# Patient Record
Sex: Male | Born: 1975
Health system: Southern US, Community
[De-identification: ages and names within clinical notes are randomized; demographics above are authoritative.]

## PROBLEM LIST (undated history)

## (undated) DIAGNOSIS — I82409 Acute embolism and thrombosis of unspecified deep veins of unspecified lower extremity: Secondary | ICD-10-CM

## (undated) DIAGNOSIS — E119 Type 2 diabetes mellitus without complications: Secondary | ICD-10-CM

## (undated) HISTORY — PX: NO PAST SURGERIES: SHX2092

---
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
## 2006-06-04 ENCOUNTER — Emergency Department: Payer: Self-pay | Admitting: Emergency Medicine

## 2017-09-13 ENCOUNTER — Other Ambulatory Visit: Payer: Self-pay

## 2017-09-13 ENCOUNTER — Emergency Department: Payer: Self-pay

## 2017-09-13 ENCOUNTER — Emergency Department
Admission: EM | Admit: 2017-09-13 | Discharge: 2017-09-13 | Disposition: A | Discharge status: Home / Self Care | Payer: Self-pay | Attending: Emergency Medicine | Admitting: Emergency Medicine

## 2017-09-13 ENCOUNTER — Encounter: Payer: Self-pay | Admitting: Emergency Medicine

## 2017-09-13 DIAGNOSIS — R519 Headache, unspecified: Secondary | ICD-10-CM

## 2017-09-13 DIAGNOSIS — H53143 Visual discomfort, bilateral: Secondary | ICD-10-CM | POA: Insufficient documentation

## 2017-09-13 DIAGNOSIS — R51 Headache: Secondary | ICD-10-CM

## 2017-09-13 LAB — BASIC METABOLIC PANEL
Anion gap: 10 (ref 5–15)
BUN: 12 mg/dL (ref 6–20)
CALCIUM: 9.1 mg/dL (ref 8.9–10.3)
CHLORIDE: 104 mmol/L (ref 101–111)
CO2: 24 mmol/L (ref 22–32)
CREATININE: 0.95 mg/dL (ref 0.61–1.24)
GFR calc Af Amer: >60 mL/min (ref >60)
GFR calc non Af Amer: >60 mL/min (ref >60)
Glucose, Bld: 87 mg/dL (ref 65–99)
Potassium: 4 mmol/L (ref 3.5–5.1)
Sodium: 138 mmol/L (ref 135–145)

## 2017-09-13 LAB — CBC
HCT: 44.8 % (ref 40.0–52.0)
Hemoglobin: 14.9 g/dL (ref 13.0–18.0)
MCH: 27.3 pg (ref 26.0–34.0)
MCHC: 33.2 g/dL (ref 32.0–36.0)
MCV: 82.1 fL (ref 80.0–100.0)
PLATELETS: 203 10*3/uL (ref 150–440)
RBC: 5.46 MIL/uL (ref 4.40–5.90)
RDW: 13.9 % (ref 11.5–14.5)
WBC: 5.3 10*3/uL (ref 3.8–10.6)

## 2017-09-13 MED ORDER — KETOROLAC TROMETHAMINE 30 MG/ML IJ SOLN
30.0000 mg | Freq: Once | INTRAMUSCULAR | Status: AC
  Administered 2017-09-13: 30 mg via INTRAVENOUS
  Filled 2017-09-13: qty 1

## 2017-09-13 MED ORDER — DIPHENHYDRAMINE HCL 50 MG/ML IJ SOLN
12.5000 mg | Freq: Once | INTRAMUSCULAR | Status: AC
  Administered 2017-09-13: 12.5 mg via INTRAVENOUS
  Filled 2017-09-13: qty 1

## 2017-09-13 MED ORDER — ONDANSETRON 4 MG PO TBDP: 4.0000 mg | ORAL_TABLET | Freq: Three times a day (TID) | ORAL | 0 refills | Status: DC | PRN

## 2017-09-13 MED ORDER — SODIUM CHLORIDE 0.9 % IV BOLUS
1000.0000 mL | Freq: Once | INTRAVENOUS | Status: AC
  Administered 2017-09-13: 1000 mL via INTRAVENOUS

## 2017-09-13 MED ORDER — PROCHLORPERAZINE EDISYLATE 10 MG/2ML IJ SOLN
10.0000 mg | Freq: Once | INTRAMUSCULAR | Status: AC
  Administered 2017-09-13: 10 mg via INTRAVENOUS
  Filled 2017-09-13: qty 2

## 2017-09-13 MED ORDER — KETOROLAC TROMETHAMINE 10 MG PO TABS: 10.0000 mg | ORAL_TABLET | Freq: Three times a day (TID) | ORAL | 0 refills | Status: DC | PRN

## 2017-09-13 NOTE — ED Notes (Signed)
Report given to Katy RN.

## 2017-09-13 NOTE — Discharge Instructions (Addendum)
Please drink plenty of fluids, eat small regular healthy meals throughout the day, and get plenty of rest to prevent headaches.  If you do develop a headache, you may take Tylenol or Toradol for your pain.  If you take Toradol, eat a small amount of food with it to prevent irritation of your stomach.  If you take Toradol, do not take any other NSAID medications including Motrin, ibuprofen, Advil, or Aleve.  Zofran is for nausea and vomiting.  To the emergency department if you develop severe pain, fever, inability to keep down fluids, new changes in vision, speech or mental status, numbness tingling or weakness, difficult he walking, or any other symptoms concerning to you.

## 2017-09-13 NOTE — ED Triage Notes (Signed)
Pt reports that he has had headaches for awhile, when he gets worried it gets worse. He was recently in [place] and they did one MRI there, he was suppose to get another one but was coming back to the [place] so he told them he would get it here. States that it is behind both eyes.

## 2017-09-13 NOTE — ED Provider Notes (Signed)
[facility] Emergency Department Provider Note  ____________________________________________  Time seen: Approximately 5:15 PM  I have reviewed the triage vital signs and the nursing notes.   HISTORY  Chief Complaint Headache    HPI Lucas Reyes is a 42 y.o. male, with a history of headaches, presenting with headache.  The patient reports that for multiple months, he has been having a severe headache, which is worse behind the orbits, and associated with photosensitivity approximately every other day.  He has been in [place], where he underwent MRI of the brain in May, which he was told was normal; however, his doctor had recommended a repeat MRI but he cannot say why.  The patient denies any fever, rash, tick bites, trauma, nausea or vomiting.  No blurred or double vision, numbness tingling or weakness, difficulty walking.  The patient has had mild headaches throughout his life but has not had migraines.  He has taken ibuprofen and Goody's powder with improvement but stopped taking this and cannot say why.  In [place], he also saw an ophthalmologist who recommended glasses for him; however, he feels the prescription is too strong and actually worsens his headaches.  He flew from [place] at the end of last week and then drove from [place] yesterday to come back to [place] and states his headache is slightly worse today but similar in character.  Fh: No family history of brain mass or cancer; no family history of multiple sclerosis or other autoimmune pathology.  History reviewed. No pertinent past medical history.  There are no active problems to display for this patient.   History reviewed. No pertinent surgical history.    Allergies Ivp dye [iodinated diagnostic agents]  History reviewed. No pertinent family history.  Social History Social History   Tobacco Use  . Smoking status: Never Smoker  Substance Use Topics  . Alcohol use: Not Currently   . Drug use: Not Currently    Review of Systems Constitutional: No fever/chills.  Lightheadedness or syncope. Eyes: Reports getting new glasses, which worsen his headache.  No blurred or double vision.  Positive photosensitivity. ENT: No sore throat. No congestion or rhinorrhea.  No dental abnormality. Cardiovascular: Denies chest pain. Denies palpitations. Respiratory: Denies shortness of breath.  No cough. Gastrointestinal: No abdominal pain.  No nausea, no vomiting.  No diarrhea.  No constipation. Genitourinary: Negative for dysuria. Musculoskeletal: Negative for back pain. Skin: Negative for rash. Neurological: + for chronic headaches. No focal numbness, tingling or weakness.  No difficulty walking.  Today photosensitivity    ____________________________________________   PHYSICAL EXAM:  VITAL SIGNS: ED Triage Vitals  Enc Vitals Group     BP 09/13/17 1340 108/65     Pulse Rate 09/13/17 1340 60     Resp 09/13/17 1340 18     Temp 09/13/17 1340 97.9 F (36.6 C)     Temp Source 09/13/17 1340 Oral     SpO2 09/13/17 1340 100 %     Weight 09/13/17 1341 170 lb (77.1 kg)     Height 09/13/17 1341 5\' 6"  (1.676 m)     Head Circumference --      Peak Flow --      Pain Score 09/13/17 1341 9     Pain Loc --      Pain Edu? --      Excl. in GC? --     Constitutional: Alert and oriented. Answers questions appropriately.  Patient does appear to be in some discomfort and has  photosensitivity. Eyes: Conjunctivae are normal.  EOMI. PERRLA.  No horizontal or vertical nystagmus.  Positive photosensitivity.  No scleral icterus. Head: Atraumatic.  No battle sign. Nose: No congestion/rhinnorhea. Mouth/Throat: Mucous membranes are moist.  Neck: No stridor.  Supple.  No JVD.  No meningismus. Cardiovascular: Normal rate, regular rhythm. No murmurs, rubs or gallops.  Respiratory: Normal respiratory effort.  No accessory muscle use or retractions. Lungs CTAB.  No wheezes, rales or  ronchi. Gastrointestinal: Soft, nontender and nondistended.  No guarding or rebound.  No peritoneal signs. Musculoskeletal: No LE edema.  Neurologic:  A&Ox3.  Speech is clear.  Face and smile are symmetric.  EOMI.  Moves all extremities well. Skin:  Skin is warm, dry and intact. No rash noted. Psychiatric: Mood and affect are normal. Speech and behavior are normal.  Normal judgement  ____________________________________________   LABS (all labs ordered are listed, but only abnormal results are displayed)  Labs Reviewed  CBC  BASIC METABOLIC PANEL  URINALYSIS, COMPLETE (UACMP) WITH MICROSCOPIC   ____________________________________________  EKG  Not indicated ____________________________________________  RADIOLOGY  No results found.  ____________________________________________   PROCEDURES  Procedure(s) performed: None  Procedures  Critical Care performed: No ____________________________________________   INITIAL IMPRESSION / ASSESSMENT AND PLAN / ED COURSE  Pertinent labs & imaging results that were available during my care of the patient were reviewed by me and considered in my medical decision making (see chart for details).  42 y.o. male presenting for chronic headaches for the last several months, associated with photosensitivity.  Overall, the patient is hemodynamically stable and afebrile.  He does not give signs or symptoms that would be consistent with meningitis or an acute infectious process.  The differential is broad but includes generalized headaches, stress-induced headaches, migraines, recurrent, intracranial mass, idiopathic hypertension, multiple sclerosis.  I do not see any evidence of CVA today.  We will plan to get basic laboratory studies, an MRI, and reevaluate the patient after symptomatic treatment.  ----------------------------------------- 7:19 PM on 09/13/2017 -----------------------------------------  Patient's work-up in the emergency  department has been reassuring but incomplete.  His laboratory studies do not show any acute process.  He did not give a urine specimen.  Initially, he refused MRI contrast so the MRI order was changed to noncontrast, after I explained to the patient the limitations of that study.  Afterwards, the patient decided that he was not willing to have an MRI in the emergency department.  He understands that by leaving prior to imaging, there are several diagnoses that I am unable to exclude.  He will follow-up with an outpatient neurologist, and I will send him home with symptomatic treatment for his bidaily headaches.  Return precautions were discussed.  ____________________________________________  FINAL CLINICAL IMPRESSION(S) / ED DIAGNOSES  Final diagnoses:  Chronic nonintractable headache, unspecified headache type  Photophobia of both eyes         NEW MEDICATIONS STARTED DURING THIS VISIT:  New Prescriptions   KETOROLAC (TORADOL) 10 MG TABLET    Take 1 tablet (10 mg total) by mouth every 8 (eight) hours as needed for moderate pain (with food).   ONDANSETRON (ZOFRAN ODT) 4 MG DISINTEGRATING TABLET    Take 1 tablet (4 mg total) by mouth every 8 (eight) hours as needed for nausea or vomiting.      Rockne MenghiniNorman, Anne-Caroline, MD 09/13/17 1920

## 2019-11-27 DIAGNOSIS — I82409 Acute embolism and thrombosis of unspecified deep veins of unspecified lower extremity: Secondary | ICD-10-CM

## 2019-11-27 HISTORY — DX: Acute embolism and thrombosis of unspecified deep veins of unspecified lower extremity: I82.409

## 2020-03-03 ENCOUNTER — Other Ambulatory Visit: Payer: Self-pay

## 2020-03-03 ENCOUNTER — Encounter (HOSPITAL_COMMUNITY): Payer: Self-pay | Admitting: *Deleted

## 2020-03-03 ENCOUNTER — Emergency Department (HOSPITAL_COMMUNITY)
Admission: EM | Admit: 2020-03-03 | Discharge: 2020-03-04 | Disposition: A | Discharge status: Home / Self Care | Payer: Self-pay | Attending: Emergency Medicine | Admitting: Emergency Medicine

## 2020-03-03 ENCOUNTER — Emergency Department (HOSPITAL_COMMUNITY): Payer: Self-pay

## 2020-03-03 DIAGNOSIS — R202 Paresthesia of skin: Secondary | ICD-10-CM | POA: Insufficient documentation

## 2020-03-03 DIAGNOSIS — R6889 Other general symptoms and signs: Secondary | ICD-10-CM | POA: Insufficient documentation

## 2020-03-03 DIAGNOSIS — H5500 Unspecified nystagmus: Secondary | ICD-10-CM | POA: Insufficient documentation

## 2020-03-03 DIAGNOSIS — R251 Tremor, unspecified: Secondary | ICD-10-CM | POA: Insufficient documentation

## 2020-03-03 DIAGNOSIS — R5381 Other malaise: Secondary | ICD-10-CM | POA: Insufficient documentation

## 2020-03-03 DIAGNOSIS — E119 Type 2 diabetes mellitus without complications: Secondary | ICD-10-CM | POA: Insufficient documentation

## 2020-03-03 DIAGNOSIS — H539 Unspecified visual disturbance: Secondary | ICD-10-CM | POA: Insufficient documentation

## 2020-03-03 DIAGNOSIS — R569 Unspecified convulsions: Secondary | ICD-10-CM | POA: Insufficient documentation

## 2020-03-03 DIAGNOSIS — R519 Headache, unspecified: Secondary | ICD-10-CM

## 2020-03-03 DIAGNOSIS — Z20822 Contact with and (suspected) exposure to covid-19: Secondary | ICD-10-CM | POA: Insufficient documentation

## 2020-03-03 HISTORY — DX: Type 2 diabetes mellitus without complications: E11.9

## 2020-03-03 LAB — CBC
HCT: 46.4 % (ref 39.0–52.0)
Hemoglobin: 14.4 g/dL (ref 13.0–17.0)
MCH: 26.6 pg (ref 26.0–34.0)
MCHC: 31 g/dL (ref 30.0–36.0)
MCV: 85.6 fL (ref 80.0–100.0)
Platelets: 278 10*3/uL (ref 150–400)
RBC: 5.42 MIL/uL (ref 4.22–5.81)
RDW: 13.5 % (ref 11.5–15.5)
WBC: 4.7 10*3/uL (ref 4.0–10.5)
nRBC: 0 % (ref 0.0–0.2)

## 2020-03-03 LAB — I-STAT CHEM 8, ED
BUN: 19 mg/dL (ref 6–20)
Calcium, Ion: 1.17 mmol/L (ref 1.15–1.40)
Chloride: 101 mmol/L (ref 98–111)
Creatinine, Ser: 1 mg/dL (ref 0.61–1.24)
Glucose, Bld: 155 mg/dL — ABNORMAL HIGH (ref 70–99)
HCT: 47 % (ref 39.0–52.0)
Hemoglobin: 16 g/dL (ref 13.0–17.0)
Potassium: 3.8 mmol/L (ref 3.5–5.1)
Sodium: 139 mmol/L (ref 135–145)
TCO2: 27 mmol/L (ref 22–32)

## 2020-03-03 LAB — COMPREHENSIVE METABOLIC PANEL
ALT: 16 U/L (ref 0–44)
AST: 14 U/L — ABNORMAL LOW (ref 15–41)
Albumin: 4 g/dL (ref 3.5–5.0)
Alkaline Phosphatase: 70 U/L (ref 38–126)
Anion gap: 11 (ref 5–15)
BUN: 16 mg/dL (ref 6–20)
CO2: 26 mmol/L (ref 22–32)
Calcium: 9.5 mg/dL (ref 8.9–10.3)
Chloride: 101 mmol/L (ref 98–111)
Creatinine, Ser: 1.06 mg/dL (ref 0.61–1.24)
GFR, Estimated: >60 mL/min (ref >60)
Glucose, Bld: 159 mg/dL — ABNORMAL HIGH (ref 70–99)
Potassium: 3.7 mmol/L (ref 3.5–5.1)
Sodium: 138 mmol/L (ref 135–145)
Total Bilirubin: 1.3 mg/dL — ABNORMAL HIGH (ref 0.3–1.2)
Total Protein: 7.1 g/dL (ref 6.5–8.1)

## 2020-03-03 LAB — DIFFERENTIAL
Abs Immature Granulocytes: 0.01 10*3/uL (ref 0.00–0.07)
Basophils Absolute: 0 10*3/uL (ref 0.0–0.1)
Basophils Relative: 0 %
Eosinophils Absolute: 0 10*3/uL (ref 0.0–0.5)
Eosinophils Relative: 1 %
Immature Granulocytes: 0 %
Lymphocytes Relative: 39 %
Lymphs Abs: 1.8 10*3/uL (ref 0.7–4.0)
Monocytes Absolute: 0.4 10*3/uL (ref 0.1–1.0)
Monocytes Relative: 8 %
Neutro Abs: 2.4 10*3/uL (ref 1.7–7.7)
Neutrophils Relative %: 52 %

## 2020-03-03 LAB — PROTIME-INR
INR: 1.2 (ref 0.8–1.2)
Prothrombin Time: 15 seconds (ref 11.4–15.2)

## 2020-03-03 LAB — APTT: aPTT: 27 seconds (ref 24–36)

## 2020-03-03 MED ORDER — SODIUM CHLORIDE 0.9% FLUSH: 3.0000 mL | Freq: Once | INTRAVENOUS | Status: DC

## 2020-03-03 NOTE — ED Triage Notes (Signed)
Pt says that he has had a headache for a "month or two". Constant. Feels like a pressure or heaviness. Denies fevers, dizziness, or vomiting. No fevers.  Numbness in the left arm that comes and goes as well for the past month or two. Mae x4, no focal weakness, steady gait.   Dr. Blenda Bridegroom called to speak with me on the phone, says he talked to the patient in the clinic today and he is concerned because the pt had a 10 hour flight yesterday and then started to c/o headache once he landed. Pt had a MRI in [place] in October that was concerning for demylation. Hx of DVT and is on Xarelto.

## 2020-03-03 NOTE — ED Notes (Signed)
Pt has results from negative PCR COVID test from yesterday prior to his international flight.

## 2020-03-04 ENCOUNTER — Emergency Department (HOSPITAL_COMMUNITY): Payer: Self-pay

## 2020-03-04 LAB — RESP PANEL BY RT-PCR (FLU A&B, COVID) ARPGX2
Influenza A by PCR: NEGATIVE
Influenza B by PCR: NEGATIVE
SARS Coronavirus 2 by RT PCR: NEGATIVE

## 2020-03-04 MED ORDER — GADOBUTROL 1 MMOL/ML IV SOLN
7.5000 mL | Freq: Once | INTRAVENOUS | Status: AC | PRN
  Administered 2020-03-04: 7.5 mL via INTRAVENOUS

## 2020-03-04 NOTE — Discharge Instructions (Signed)
Get help right away if you: °Feel weak. °Have trouble walking or moving. °Have problems with speech, understanding, or vision. °Feel confused. °Cannot control your bladder or bowel movements. °Have numbness after an injury. °Develop new weakness in an arm or leg. °Faint. °

## 2020-03-04 NOTE — ED Notes (Signed)
Off floor to MRI

## 2020-03-04 NOTE — ED Notes (Signed)
Pt discharged ambulatory. All questions and concerns addressed. No complaints at this time.   

## 2020-03-04 NOTE — ED Provider Notes (Signed)
[facility] EMERGENCY DEPARTMENT Provider Note   CSN: [id number] Arrival date & time: 03/03/20  2107     History Chief Complaint  Patient presents with  . Headache    Lucas Reyes is a 44 y.o. male  Who presents with headache. He is a dual citizen of the [place] and [place] but spends most of his time in [place]. The  Patient's sxs began in 2019 with left sided head and arm paresthesia. He had a work up then and improved. He had recurrence of sxs with associated left sided head pressure, generalized malaise. He was seen by a neurologist who diagnosed him with central seizures after an EEG. He was started on Keppra. This did not seem to help and he no longer takes this medication. Over the past year his sxs have been intermittent, Waxing and waning. He has had episodes of vertigo, multiple days of insomnia, generalized tremors and and facial fasciculations, Worsening vision and persistent pressure sensation behind his left eye. He had a repeat MRI on Oct. 28 in [place] and was told he may have demyelinating lesions on the scan. He flew here on Monday 03/02/20 as he felt he was not getting the help he needed out of the country. He currently has the head pressure but denies paresthesia. He has a hx of DVT and is on xarelto. He takes 15 mg nightly, but did not take it last night as he did not expect to have a 10 hour wait in the ER.   HPI     Past Medical History:  Diagnosis Date  . Diabetes mellitus without complication (HCC)   . DVT (deep venous thrombosis) (HCC)     There are no problems to display for this patient.   History reviewed. No pertinent surgical history.     No family history on file.  Social History   Tobacco Use  . Smoking status: Never Smoker  Substance Use Topics  . Alcohol use: Not Currently  . Drug use: Not Currently    Home Medications Prior to Admission medications   Medication Sig Start Date End Date Taking? Authorizing Provider  ketorolac  (TORADOL) 10 MG tablet Take 1 tablet (10 mg total) by mouth every 8 (eight) hours as needed for moderate pain (with food). 09/13/17   Rockne Menghini, MD  ondansetron (ZOFRAN ODT) 4 MG disintegrating tablet Take 1 tablet (4 mg total) by mouth every 8 (eight) hours as needed for nausea or vomiting. 09/13/17   Rockne Menghini, MD    Allergies    Ivp dye [iodinated diagnostic agents]  Review of Systems   Review of Systems Ten systems reviewed and are negative for acute change, except as noted in the HPI.   Physical Exam Updated Vital Signs BP 112/78   Pulse 66   Temp 98.3 F (36.8 C) (Oral)   Resp 15   SpO2 100%   Physical Exam Vitals reviewed.  Constitutional:      General: He is not in acute distress.    Appearance: He is well-developed.  HENT:     Head: Normocephalic and atraumatic.     Mouth/Throat:     Mouth: Mucous membranes are moist.  Eyes:     General: No visual field deficit or scleral icterus.    Extraocular Movements: Extraocular movements intact.     Right eye: Nystagmus present. Normal extraocular motion.     Left eye: Nystagmus present. Normal extraocular motion.     Pupils: Pupils are equal, round, and  reactive to light.     Comments: Nystagmus- toward the Right, the right eye beats rapidly while the left eye beats slowly. There is some nystagmus with upward gaze, no sensation of vertigo.  Cardiovascular:     Rate and Rhythm: Normal rate.     Heart sounds: Normal heart sounds.  Pulmonary:     Effort: Pulmonary effort is normal.     Breath sounds: Normal breath sounds.  Abdominal:     General: There is no distension.     Palpations: Abdomen is soft.  Musculoskeletal:        General: No swelling. Normal range of motion.     Cervical back: Normal range of motion.  Skin:    General: Skin is warm and dry.     Capillary Refill: Capillary refill takes less than 2 seconds.  Neurological:     Mental Status: He is alert.     GCS: GCS eye subscore is  4. GCS verbal subscore is 5. GCS motor subscore is 6.     Cranial Nerves: Cranial nerve deficit present. No dysarthria or facial asymmetry.     Sensory: Sensation is intact. No sensory deficit.     Motor: Tremor present. No weakness, abnormal muscle tone, seizure activity or pronator drift.     Coordination: Coordination normal. Finger-Nose-Finger Test and Heel to [facility] Test normal. Rapid alternating movements normal.     Deep Tendon Reflexes: Babinski sign absent on the right side. Babinski sign absent on the left side.     Reflex Scores:      Patellar reflexes are 1+ on the right side and 1+ on the left side.    Comments: No myoclonusat the ankles     ED Results / Procedures / Treatments   Labs (all labs ordered are listed, but only abnormal results are displayed) Labs Reviewed  COMPREHENSIVE METABOLIC PANEL - Abnormal; Notable for the following components:      Result Value   Glucose, Bld 159 (*)    AST 14 (*)    Total Bilirubin 1.3 (*)    All other components within normal limits  I-STAT CHEM 8, ED - Abnormal; Notable for the following components:   Glucose, Bld 155 (*)    All other components within normal limits  RESP PANEL BY RT-PCR (FLU A&B, COVID) [id number]  PROTIME-INR  APTT  CBC  DIFFERENTIAL  CBG MONITORING, ED    EKG EKG Interpretation  Date/Time:  Tuesday March 03 2020 21:48:03 EST Ventricular Rate:  83 PR Interval:  140 QRS Duration: 82 QT Interval:  374 QTC Calculation: 439 R Axis:   72 Text Interpretation: Normal sinus rhythm Right atrial enlargement Borderline ECG No previous ECGs available Confirmed by Alvira Monday ([id number]) on 03/04/2020 6:06:34 AM   Radiology CT HEAD WO CONTRAST  Result Date: 03/03/2020 CLINICAL DATA:  Headache EXAM: CT HEAD WITHOUT CONTRAST TECHNIQUE: Contiguous axial images were obtained from the base of the skull through the vertex without intravenous contrast. COMPARISON:  None. FINDINGS: Brain: No evidence of acute  territorial infarction, hemorrhage, hydrocephalus,extra-axial collection or mass lesion/mass effect. Normal gray-white differentiation. Ventricles are normal in size and contour. Vascular: No hyperdense vessel or unexpected calcification. Skull: The skull is intact. No fracture or focal lesion identified. Sinuses/Orbits: Right sphenoid sinus mucosal thickening is seen. The orbits and globes intact. Other: None IMPRESSION: No acute intracranial abnormality. Electronically Signed   By: Jonna Clark M.D.   On: 03/03/2020 22:30   MR Brain W and Wo Contrast  Result Date: 03/04/2020 CLINICAL DATA:  Headache, new or worsening. Additional history provided: Numbness in left arm. EXAM: MRI HEAD WITHOUT AND WITH CONTRAST TECHNIQUE: Multiplanar, multiecho pulse sequences of the brain and surrounding structures were obtained without and with intravenous contrast. CONTRAST:  7.48mL GADAVIST GADOBUTROL 1 MMOL/ML IV SOLN COMPARISON:  Head CT 03/03/2020. Concurrently performed MRI of the cervical spine 03/04/2020. FINDINGS: Brain: Cerebral volume is normal. Mild-to-moderate multifocal T2/FLAIR hyperintensity within the cerebral white matter, advanced for age. No abnormal intracranial enhancement is identified. There is no acute infarct. No evidence of intracranial mass. No chronic intracranial blood products. No extra-axial fluid collection. No midline shift. Vascular: Expected proximal arterial flow voids. Skull and upper cervical spine: No focal marrow lesion. Sinuses/Orbits: Visualized orbits show no acute finding. Mild paranasal sinus mucosal thickening, most notably within the right sphenoid sinus. Other: Trace fluid within the left mastoid air cells. IMPRESSION: No evidence of acute intracranial abnormality. Mild-to-moderate multifocal T2/FLAIR hyperintense signal abnormality within the cerebral white matter, advanced for age. Findings are nonspecific but most often related to chronic small vessel ischemia. Alternative  differential considerations include a demyelinating process such as multiple sclerosis, sequelae of a prior infectious/inflammatory process and vasculitis, among others. Mild paranasal sinus mucosal thickening, most notably right sphenoidal. Electronically Signed   By: Jackey Loge DO   On: 03/04/2020 10:35   MR Cervical Spine W or Wo Contrast  Result Date: 03/04/2020 CLINICAL DATA:  Demyelinating disease. Additional history provided: Patient reports headache for " a month or two." Numbness in left arm intermittently. EXAM: MRI CERVICAL SPINE WITHOUT AND WITH CONTRAST TECHNIQUE: Multiplanar and multiecho pulse sequences of the cervical spine, to include the craniocervical junction and cervicothoracic junction, were obtained without and with intravenous contrast. CONTRAST:  7.10mL GADAVIST GADOBUTROL 1 MMOL/ML IV SOLN COMPARISON:  Concurrently performed brain MRI 03/04/2020. FINDINGS: Alignment: Reversal of the expected cervical lordosis. No significant spondylolisthesis. Vertebrae: Vertebral body height is maintained. No significant marrow edema or focal suspicious osseous lesion. Focal fat versus small hemangiomas within the C6 vertebral body. Cord: No definite spinal cord signal abnormality is identified. Apparent hyperintense signal abnormality within the spinal cord at the T2-T3 level is appreciated on the sagittal STIR sequence only. This signal abnormality is not appreciated on the sagittal T2 sequence and is likely artifactual. No abnormal cord enhancement. Posterior Fossa, vertebral arteries, paraspinal tissues: Posterior fossa better assessed on concurrently performed brain MRI. Flow voids preserved within the imaged cervical vertebral arteries. Paraspinal soft tissues within normal limits. Disc levels: Mild disc degeneration throughout the cervical spine. C2-C3: No significant disc herniation or stenosis. C3-C4: Disc bulge. Right greater than left uncinate hypertrophy. No significant spinal canal  stenosis. Bilateral neural foraminal narrowing (moderate right, mild left). C4-C5: Disc bulge. Superimposed small central disc protrusion. Uncinate hypertrophy (greater on the right). The disc protrusion contacts the ventral spinal cord and contributes to mild relative spinal canal narrowing. Mild right neural foraminal narrowing. C5-C6: Shallow disc bulge. Superimposed tiny left center disc protrusion. Mild partial effacement of the ventral thecal sac without significant central canal stenosis. No significant foraminal stenosis. C6-C7: Broad-based right center disc protrusion contributing to mild relative spinal canal narrowing and contacting the ventral spinal cord. No significant foraminal stenosis. C7-T1: No significant disc herniation or spinal canal stenosis. Endplate spurring contributes to mild relative right neural foraminal narrowing. IMPRESSION: No definite spinal cord signal abnormality is identified. Apparent cord signal abnormality at T2-T3 is appreciated on the sagittal STIR sequence only, and favored artifactual as described.  Cervical spondylosis as outlined. No more than mild spinal canal stenosis at any level. Disc protrusions contact the ventral spinal cord at C4-C5 and C6-C7. Moderate C3-C4 right neural foraminal narrowing. Additional sites of mild neural foraminal narrowing as detailed. Nonspecific reversal of the expected cervical lordosis. Electronically Signed   By: Jackey Loge DO   On: 03/04/2020 10:50    Procedures Procedures (including critical care time)  Medications Ordered in ED Medications  sodium chloride flush (NS) 0.9 % injection 3 mL (3 mLs Intravenous Not Given 03/04/20 0924)  gadobutrol (GADAVIST) 1 MMOL/ML injection 7.5 mL (7.5 mLs Intravenous Contrast Given 03/04/20 1010)    ED Course  I have reviewed the triage vital signs and the nursing notes.  Pertinent labs & imaging results that were available during my care of the patient were reviewed by me and considered  in my medical decision making (see chart for details).    MDM Rules/Calculators/A&P                          [id number] VS:  Vitals:   03/04/20 0815 03/04/20 0830 03/04/20 0845 03/04/20 1115  BP: 113/81 117/79 112/75 112/78  Pulse: 65 70 62 66  Resp: (!) 21 (!) 21 19 15   Temp:      TempSrc:      SpO2: 100% 100% 100% 100%    is gathered by patient and outside records which the patient had on his phone. Previous records obtained and reviewed. DDX:The patient's complaint of paresthesia involves an extensive number of diagnostic and treatment options, and is a complaint that carries with it a high risk of complications, morbidity, and potential mortality. Given the large differential diagnosis, medical decision making is of high complexity. The differential diagnosis of paresthesias includes but is not limited IE:PPIRJJO, diabetic neuropathy, es mellitus, entrapment neuropathy, eg, carpal tunnel syndrome, tarsal tunnel syndrome, meralgia paresthetica) hypocalcemia, multiple sclerosis, spinal cord lesion, nerve root compression, herpes zoster, transient ischemic attack, Guillain-Barr syndrome, trigeminal neuralgia, migraine, partial seizure, reflex sympathetic dystrophy, thoracic outlet syndrome, brachial plexus neuropathy.  Labs: I ordered reviewed and interpreted labs which include CBC, pt-inr, aptt, and resp panel WNL. CMP with elevated glucose of insignificant value.  Imaging: I ordered and reviewed images which included ct head, MRI brain and cspine.. I independently visualized and interpreted all imaging. Significant findings include nonspecific hyper-intensities may be due to chronic microvascular disease versus MS. There are no acute, significant findings on today's images. EKG: EKG shows normal sinus rhythm at a rate of 83 Consults: Case and imaging findings discussed with Dr. AC:[id number].  Findings are nonspecific however given his constellation of symptoms I have  high concern for diagnosis of MS .  MDM: Patient here for evaluation after chronic intermittent and progressively worsening neurologic symptoms.  High concern for diagnosis of MS given multiple T2 weighted flares on MRI imaging.  I was able to secure an outpatient neurology consult with Dr.Sater on Monday at 8:30 am I discussed all findings with the patient.  He was given a burned copy of his MRI  Patient disposition:The patient appears reasonably screened and/or stabilized for discharge and I doubt any other medical condition or other [facility] requiring further screening, evaluation, or treatment in the ED at this time prior to discharge. I have discussed lab and/or imaging findings with the patient and answered all questions/concerns to the best of my ability.I have discussed return precautions and OP follow up.  Final Clinical Impression(s) / ED Diagnoses Final diagnoses:  Pressure in head  Visual disturbance  Paresthesia    Rx / DC Orders ED Discharge Orders         Ordered    Ambulatory referral to Neurology       Comments: An appointment is requested in approximately: 1 week   03/04/20 1111           Arthor CaptainHarris, Soniyah Mcglory, PA-C 03/04/20 1540    Jacalyn LefevreHaviland, Julie, MD 03/05/20 [phone]

## 2020-03-09 ENCOUNTER — Other Ambulatory Visit: Payer: Self-pay

## 2020-03-09 ENCOUNTER — Encounter: Payer: Self-pay | Admitting: Neurology

## 2020-03-09 ENCOUNTER — Ambulatory Visit: Payer: Self-pay | Admitting: Neurology

## 2020-03-09 VITALS — BP 121/77 | HR 99 | Ht 67.0 in | Wt 147.0 lb

## 2020-03-09 DIAGNOSIS — H539 Unspecified visual disturbance: Secondary | ICD-10-CM | POA: Insufficient documentation

## 2020-03-09 DIAGNOSIS — R519 Headache, unspecified: Secondary | ICD-10-CM | POA: Insufficient documentation

## 2020-03-09 DIAGNOSIS — Z86718 Personal history of other venous thrombosis and embolism: Secondary | ICD-10-CM | POA: Insufficient documentation

## 2020-03-09 DIAGNOSIS — R9082 White matter disease, unspecified: Secondary | ICD-10-CM | POA: Insufficient documentation

## 2020-03-09 DIAGNOSIS — G4489 Other headache syndrome: Secondary | ICD-10-CM

## 2020-03-09 NOTE — Progress Notes (Signed)
[facility]  PATIENT: Lucas Reyes DOB: 1975-12-17  REFERRING DOCTOR OR PCP:  Arthor Captain, PA-C; Dr. Wyline Beady SOURCE: Patient, notes from emergency room, imaging and laboratory reports, MRI images personally reviewed.  _________________________________   HISTORICAL  CHIEF COMPLAINT:  Chief Complaint  Patient presents with  . New Patient (Initial Visit)    RM 13. ED referral for abnormal MRI.     HISTORY OF PRESENT ILLNESS:  I had the pleasure of seeing patient, Lucas Reyes, at [facility] for neurologic consultation regarding his headaches and abnormal MRI.  He is a 44 year old man who has had pressure like headaches off/om for a few years and dailysince January 2021.  Pain has worsened over the past few months.   He also has felt sick and has had shivering.   He is from [place] and symptoms started while he was there.  He had an MRI in [place] that reportedly showed foci in the brain.     He has noted more issues with vision as well.      He notes pain is present when he wakes up.   Quality is pressure pain and the head feels heavy.    Vision is blurry when pain is worse.   He denies nausea.  He does no note photophobia but has some phonophobia when pain is more intense.     He has been on several medications including benzodiazepines while in [place].   He does not think that this helped much.  Gabapentin 100 mg p.o. 3 times daily was restarted and he is to titrate up slowly to 300 mg p.o. 3 times daily.  He also reports visual blurriness worse over the last couple months.  Eyes are fairly symmetric.  He had an eye exam earlier this year in [place].  Vascular risks:  No Hypertension.  He has had glucose that is slightly elevated at times and tild he could be pre-diabetic.   He does not smoke (rare social smoker many years ago).   No cardiovascular issues but he has occasional palpitations.   He had a DVT and is on Xarelto.  This was diagnosed  about 3 to 4 months ago.  Imaging review: MRI 03/04/2020 showed T2/FLAIR hyperintense foci predominantly in the subcortical and deep white matter.  Sagittal images did not show foci in the corpus callosum or callosal septal fibers.  There were no acute findings.  Normal enhancement pattern.  The findings are most consistent with chronic microvascular ischemic change or even sequela of migraine headache.  Demyelination or vasculitis would be less likely to have this pattern.  CT 17510 21 shows mild hypodense foci predominantly in the left parietal lobe subcortical white matter.  REVIEW OF SYSTEMS: Constitutional: No fevers, chills, sweats, or change in appetite Eyes: He notes blurry vision. Ear, nose and throat: No hearing loss, ear pain, nasal congestion, sore throat Cardiovascular: No chest pain, palpitations Respiratory: No shortness of breath at rest or with exertion.   No wheezes GastrointestinaI: No nausea, vomiting, diarrhea, abdominal pain, fecal incontinence Genitourinary: No dysuria, urinary retention or frequency.  No nocturia. Musculoskeletal: No neck pain, back pain Integumentary: No rash, pruritus, skin lesions Neurological: as above Psychiatric: No depression at this time.  No anxiety Endocrine: No palpitations, diaphoresis, change in appetite, change in weigh or increased thirst Hematologic/Lymphatic: No anemia, purpura, petechiae. Allergic/Immunologic: No itchy/runny eyes, nasal congestion, recent allergic reactions, rashes  ALLERGIES: Allergies  Allergen Reactions  . Ivp Dye [Iodinated Diagnostic Agents]  HOME MEDICATIONS:  Current Outpatient Medications:  Marland Kitchen  GABAPENTIN PO, Take 100 mg by mouth every 8 (eight) hours., Disp: , Rfl:  .  metFORMIN (GLUCOPHAGE) 500 MG tablet, Take 500 mg by mouth daily with breakfast., Disp: , Rfl:  .  Rivaroxaban (XARELTO PO), Take 15 mg by mouth daily., Disp: , Rfl:   PAST MEDICAL HISTORY: Past Medical History:  Diagnosis  Date  . Diabetes mellitus without complication (HCC)   . DVT (deep venous thrombosis) (HCC) 11/2019    PAST SURGICAL HISTORY: Past Surgical History:  Procedure Laterality Date  . NO PAST SURGERIES      FAMILY HISTORY: History reviewed. No pertinent family history.  SOCIAL HISTORY:  Social History   Socioeconomic History  . Marital status: Married    Spouse name: Not on file  . Number of children: Not on file  . Years of education: Not on file  . Highest education level: Not on file  Occupational History  . Not on file  Tobacco Use  . Smoking status: Never Smoker  . Smokeless tobacco: Never Used  Substance and Sexual Activity  . Alcohol use: Not Currently  . Drug use: Not Currently  . Sexual activity: Yes  Other Topics Concern  . Not on file  Social History Narrative   Right handed   Tea   Social Determinants of Health   Financial Resource Strain: Not on file  Food Insecurity: Not on file  Transportation Needs: Not on file  Physical Activity: Not on file  Stress: Not on file  Social Connections: Not on file  Intimate Partner Violence: Not on file     PHYSICAL EXAM  Vitals:   03/09/20 0913  BP: 120/80  Weight: 147 lb (66.7 kg)  Height: 5\' 7"  (1.702 m)    Body mass index is 23.02 kg/m.   Hearing Screening   125Hz  250Hz  500Hz  1000Hz  2000Hz  3000Hz  4000Hz  6000Hz  8000Hz   Right ear:           Left ear:             Visual Acuity Screening   Right eye Left eye Both eyes  Without correction: 20/50 20/50 20/30   With correction:       General: The patient is well-developed and well-nourished and in no acute distress  HEENT:  Head is [place]/AT.  Sclera are anicteric.  Funduscopic exam shows normal optic discs and retinal vessels.  Neck: No carotid bruits are noted.  The neck is nontender.  Cardiovascular: The heart has a regular rate and rhythm with a normal S1 and S2. There were no murmurs, gallops or rubs.    Skin: Extremities are without rash or   edema.  Musculoskeletal:  Back is nontender  Neurologic Exam  Mental status: The patient is alert and oriented x 3 at the time of the examination. The patient has apparent normal recent and remote memory, with an apparently normal attention span and concentration ability.   Speech is normal.  Cranial nerves: Extraocular movements are full. Pupils are equal, round, and reactive to light and accomodation. Color visio.  Facial symmetry is present. There is good facial sensation to soft touch bilaterally.Facial strength is normal.  Trapezius and sternocleidomastoid strength is normal. No dysarthria is noted.  The tongue is midline, and the patient has symmetric elevation of the soft palate. No obvious hearing deficits are noted.  Motor:  Muscle bulk is normal.   Tone is normal. Strength is  5 / 5 in all 4 extremities.  Sensory: Sensory testing is intact to pinprick, soft touch and vibration sensation in all 4 extremities.  Coordination: Cerebellar testing reveals good finger-nose-finger and heel-to-shin bilaterally.  Gait and station: Station is normal.   Gait is normal. Tandem gait is normal. Romberg is negative.   Reflexes: Deep tendon reflexes are symmetric and normal bilaterally.       DIAGNOSTIC DATA (LABS, IMAGING, TESTING) - I reviewed patient records, labs, notes, testing and imaging myself where available.  Lab Results  Component Value Date   WBC 4.7 03/03/2020   HGB 16.0 03/03/2020   HCT 47.0 03/03/2020   MCV 85.6 03/03/2020   PLT 278 03/03/2020      Component Value Date/Time   NA 139 03/03/2020 2216   K 3.8 03/03/2020 2216   CL 101 03/03/2020 2216   CO2 26 03/03/2020 2144   GLUCOSE 155 (H) 03/03/2020 2216   BUN 19 03/03/2020 2216   CREATININE 1.00 03/03/2020 2216   CALCIUM 9.5 03/03/2020 2144   PROT 7.1 03/03/2020 2144   ALBUMIN 4.0 03/03/2020 2144   AST 14 (L) 03/03/2020 2144   ALT 16 03/03/2020 2144   ALKPHOS 70 03/03/2020 2144   BILITOT 1.3 (H) 03/03/2020  2144   GFRNONAA >60 03/03/2020 2144   GFRAA >60 09/13/2017 1729       ASSESSMENT AND PLAN  Other headache syndrome - Plan: ANA w/Reflex, Sedimentation rate, ANCA TITERS, ECHOCARDIOGRAM COMPLETE BUBBLE STUDY, Homocysteine, ANCA TITERS, Ambulatory referral to Ophthalmology  White matter abnormality on MRI of brain - Plan: ANA w/Reflex, Sedimentation rate, ANCA TITERS, ECHOCARDIOGRAM COMPLETE BUBBLE STUDY, Homocysteine, ANCA TITERS  History of DVT in adulthood  Vision disturbance - Plan: Ambulatory referral to Ophthalmology   In summary, Mr. Lucas Reyes is a 44 year old man who has had headaches over the last couple years that have worsened more recently.  Additionally he has noted more recent visual disturbances.  An MRI showed white matter changes.  I believe these are much more consistent with chronic microvascular ischemic change or possibly sequela of cardio emboli than to demyelination.  1.   Check bubble contrasted ECHO to r/o ASD/PFO.    If present, recommend aspirian and referral to cardiology.  2.   Check labs for vasculitis, elevated homocysteine.   3.   Continue gabapentin.   If not better, consider a tricyclic. 4.  Spoke to Dr. Wyline Beady ([facility]) who has seen him.    5.   He will return to see me in several weeks or sooner if there are new or worsening neurologic symptoms.  Cherilynn Schomburg A. Epimenio Foot, MD, [facility] 03/09/2020, 9:22 AM Certified in Neurology, Clinical Neurophysiology, Sleep Medicine and Neuroimaging  [facility] [address], [address] [place], [place] [address] [phone]

## 2020-03-11 ENCOUNTER — Telehealth: Payer: Self-pay | Admitting: Neurology

## 2020-03-11 LAB — ANA W/REFLEX: Anti Nuclear Antibody (ANA): NEGATIVE

## 2020-03-11 LAB — HOMOCYSTEINE: Homocysteine: 6.2 umol/L (ref 0.0–14.5)

## 2020-03-11 LAB — ANCA TITERS
Atypical pANCA: <1:20 {titer}
C-ANCA: <1:20 {titer}
P-ANCA: <1:20 {titer}

## 2020-03-11 LAB — SEDIMENTATION RATE: Sed Rate: 2 mm/hr (ref 0–15)

## 2020-03-11 NOTE — Telephone Encounter (Signed)
Patient called to check status of his referral I relayed to patient I would Have referral sent buy the end of the day today . I gave Patient Telephone Number and Address .

## 2020-03-17 ENCOUNTER — Ambulatory Visit (HOSPITAL_COMMUNITY)
Admission: RE | Admit: 2020-03-17 | Discharge: 2020-03-17 | Disposition: A | Discharge status: Home / Self Care | Payer: Self-pay | Source: Ambulatory Visit | Attending: Neurology | Admitting: Neurology

## 2020-03-17 ENCOUNTER — Other Ambulatory Visit: Payer: Self-pay

## 2020-03-17 DIAGNOSIS — Q211 Atrial septal defect: Secondary | ICD-10-CM

## 2020-03-17 DIAGNOSIS — E119 Type 2 diabetes mellitus without complications: Secondary | ICD-10-CM | POA: Insufficient documentation

## 2020-03-17 DIAGNOSIS — Q231 Congenital insufficiency of aortic valve: Secondary | ICD-10-CM | POA: Insufficient documentation

## 2020-03-17 DIAGNOSIS — G4489 Other headache syndrome: Secondary | ICD-10-CM | POA: Insufficient documentation

## 2020-03-17 DIAGNOSIS — R9082 White matter disease, unspecified: Secondary | ICD-10-CM | POA: Insufficient documentation

## 2020-03-17 NOTE — Progress Notes (Signed)
  Echocardiogram 2D Echocardiogram has been performed.  

## 2020-03-18 ENCOUNTER — Telehealth: Payer: Self-pay | Admitting: Neurology

## 2020-03-18 DIAGNOSIS — R9082 White matter disease, unspecified: Secondary | ICD-10-CM

## 2020-03-18 DIAGNOSIS — H539 Unspecified visual disturbance: Secondary | ICD-10-CM

## 2020-03-18 DIAGNOSIS — G4489 Other headache syndrome: Secondary | ICD-10-CM

## 2020-03-18 DIAGNOSIS — Q2111 Secundum atrial septal defect: Secondary | ICD-10-CM | POA: Insufficient documentation

## 2020-03-18 NOTE — Telephone Encounter (Signed)
I spoke to Lucas Reyes about the echocardiogram showing a atrial septal defect.  Seem to show flow was more left to right though bubble was did pass from right to left.  MRI shows multiple subcortical foci potentially worrisome for cardioembolic events.  Of note, he is on Eliquis due to DVT.  I will set him up to see cardiology to help determine whether this is something that should be closed or if he should stay on anticoagulation.  He continues to experience some headaches and I will have him increase the gabapentin to 600 mg total a day in split dose.  Consider a tricyclic if this does not help

## 2020-03-18 NOTE — Telephone Encounter (Signed)
Pt called, as instructed to notify Dr. Epimenio Foot when I have done the Echocardiogram. I complete  Echocardiogram on 12/21. Would like a call from the nurse.

## 2020-03-19 ENCOUNTER — Telehealth: Payer: Self-pay

## 2020-03-19 NOTE — Telephone Encounter (Signed)
Received DOD call from Dr. Nilsa Nutting with the pain clinic about echo bubble study. Dr. Eden Emms spoke with Dr. Nilsa Nutting. Per Dr. Eden Emms, patient needs new patient appointment with Dr. Shari Prows to discuss findings on echo. Will fax echo report to Dr. Nilsa Nutting.  Left message for patient to call back our office, so he can schedule an appointment with Dr. Shari Prows

## 2020-03-19 NOTE — Telephone Encounter (Signed)
appt scheduled with Dr Laurance Flatten 04/09/2020

## 2020-03-31 NOTE — Progress Notes (Deleted)
Cardiology Office Note:    Date:  03/31/2020   ID:  Lucas Reyes, DOB February 25, 1976, MRN [id number]  PCP:  System, Provider Not In  [facility] Cardiologist:  No primary care provider on file.  [facility] Electrophysiologist:  None   Referring MD: Asa Lente, MD    History of Present Illness:    Lucas Reyes is a 45 y.o. male with a hx of DMII, DVT on xarelto and secundum atrial septal defect who was referred by Dr. Epimenio Foot for further evaluation of possible cardiac source of emboli.  Patient was seen by Dr. Epimenio Foot on 03/09/20 where he was noted to have WM changes on MRI with concern for chronic, microvascular ischemic changes or possible sequela of cardioemboli rather than demyelination. TTE with bubble was performed which revealed a secundum ASD with L-->R shunting. No RA or RV enlargement and RV systolic function was normal. The patient is now referred here for further management.  Past Medical History:  Diagnosis Date  . Diabetes mellitus without complication (HCC)   . DVT (deep venous thrombosis) (HCC) 11/2019    Past Surgical History:  Procedure Laterality Date  . NO PAST SURGERIES      Current Medications: No outpatient medications have been marked as taking for the 04/09/20 encounter (Appointment) with Meriam Sprague, MD.     Allergies:   Ivp dye [iodinated diagnostic agents]   Social History   Socioeconomic History  . Marital status: Married    Spouse name: Not on file  . Number of children: Not on file  . Years of education: Not on file  . Highest education level: Not on file  Occupational History  . Not on file  Tobacco Use  . Smoking status: Never Smoker  . Smokeless tobacco: Never Used  Substance and Sexual Activity  . Alcohol use: Not Currently  . Drug use: Not Currently  . Sexual activity: Yes  Other Topics Concern  . Not on file  Social History Narrative   Right handed   Tea   Social Determinants of Health   Financial Resource  Strain: Not on file  Food Insecurity: Not on file  Transportation Needs: Not on file  Physical Activity: Not on file  Stress: Not on file  Social Connections: Not on file     Family History: The patient's ***family history is not on file.  ROS:   Please see the history of present illness.    *** All other systems reviewed and are negative.  EKGs/Labs/Other Studies Reviewed:    The following studies were reviewed today: TTE 02-Apr-2020: IMPRESSIONS    1. A secundum atrial septal defect is present with left to right  shunting. No RA or RV enlargement and RV systolic function is normal.  2. The aortic valve is bicuspid. It is mildly thickened and calcified. No  aortic stenosis or regurgitation. Aortic root and ascending aorta are  normal in size. No evidence of coarctation.  3. Left ventricular ejection fraction, by estimation, is >75%. The left  ventricle has hyperdynamic function. The left ventricle has no regional  wall motion abnormalities. Left ventricular diastolic parameters were  normal.  4. Right ventricular systolic function is normal. The right ventricular  size is normal.  5. The mitral valve leaflets are mildly thickened. Trivial mitral valve  regurgitation.  6. The aortic valve is bicuspid. There is mild thickening of the aortic  valve. Aortic valve regurgitation is not visualized.  7. The inferior vena cava is normal in size  with greater than 50%  respiratory variability, suggesting right atrial pressure of 3 mmHg.  8. There is a secundum atrial septal defect with predominantly left to  right shunting across the atrial septum. Agitated saline contrast bubble  study was positive with shunting observed within 3-6 cardiac cycles  suggestive of interatrial shunt.   MRI 03/04/2020 showed T2/FLAIR hyperintense foci predominantly in the subcortical and deep white matter.  Sagittal images did not show foci in the corpus callosum or callosal septal fibers.  There  were no acute findings.  Normal enhancement pattern.  The findings are most consistent with chronic microvascular ischemic change or even sequela of migraine headache.  Demyelination or vasculitis would be less likely to have this pattern.  CT 03/03/20 shows mild hypodense foci predominantly in the left parietal lobe subcortical white matter.  EKG:  EKG is *** ordered today.  The ekg ordered today demonstrates ***  Recent Labs: 03/03/2020: ALT 16; BUN 19; Creatinine, Ser 1.00; Hemoglobin 16.0; Platelets 278; Potassium 3.8; Sodium 139  Recent Lipid Panel No results found for: CHOL, TRIG, HDL, CHOLHDL, VLDL, LDLCALC, LDLDIRECT   Risk Assessment/Calculations:   {Does this patient have ATRIAL FIBRILLATION?:[phone]}   Physical Exam:    VS:  There were no vitals taken for this visit.    Wt Readings from Last 3 Encounters:  03/09/20 147 lb (66.7 kg)  09/13/17 170 lb (77.1 kg)     GEN: *** Well nourished, well developed in no acute distress HEENT: Normal NECK: No JVD; No carotid bruits LYMPHATICS: No lymphadenopathy CARDIAC: ***RRR, no murmurs, rubs, gallops RESPIRATORY:  Clear to auscultation without rales, wheezing or rhonchi  ABDOMEN: Soft, non-tender, non-distended MUSCULOSKELETAL:  No edema; No deformity  SKIN: Warm and dry NEUROLOGIC:  Alert and oriented x 3 PSYCHIATRIC:  Normal affect   ASSESSMENT:    No diagnosis found. PLAN:    In order of problems listed above:  #Secundum ASD: MRI head suggestive of possible cardioembolic WM changes. Patient with history of DVT on xarelto. Given concern for possible strokes, will need ASD closure. -Refer to Dr. Excell Seltzer for evaluation of ASD closure -Continue xarelto  #DVT: -On xarelto   {Are you ordering a CV Procedure (e.g. stress test, cath, DCCV, TEE, etc)?   Press F2        :[id number]}    Medication Adjustments/Labs and Tests Ordered: Current medicines are reviewed at length with the patient today.  Concerns regarding  medicines are outlined above.  No orders of the defined types were placed in this encounter.  No orders of the defined types were placed in this encounter.   There are no Patient Instructions on file for this visit.   Signed, Meriam Sprague, MD  03/31/2020 12:48 PM    [facility]

## 2020-04-06 ENCOUNTER — Ambulatory Visit (INDEPENDENT_AMBULATORY_CARE_PROVIDER_SITE_OTHER): Payer: Self-pay | Admitting: Neurology

## 2020-04-06 ENCOUNTER — Encounter: Payer: Self-pay | Admitting: Neurology

## 2020-04-06 VITALS — BP 111/69 | HR 77 | Ht 69.5 in | Wt 147.0 lb

## 2020-04-06 DIAGNOSIS — Q2111 Secundum atrial septal defect: Secondary | ICD-10-CM

## 2020-04-06 DIAGNOSIS — R519 Headache, unspecified: Secondary | ICD-10-CM

## 2020-04-06 DIAGNOSIS — R9082 White matter disease, unspecified: Secondary | ICD-10-CM

## 2020-04-06 DIAGNOSIS — H539 Unspecified visual disturbance: Secondary | ICD-10-CM

## 2020-04-06 DIAGNOSIS — Z86718 Personal history of other venous thrombosis and embolism: Secondary | ICD-10-CM

## 2020-04-06 DIAGNOSIS — Q211 Atrial septal defect: Secondary | ICD-10-CM

## 2020-04-06 NOTE — Progress Notes (Signed)
[facility]  PATIENT: Lucas Reyes DOB: 09-13-1975  REFERRING DOCTOR OR PCP:  Margarita Mail, PA-C; Dr. Melina Fiddler SOURCE: Patient, notes from emergency room, imaging and laboratory reports, MRI images personally reviewed.  _________________________________   HISTORICAL  CHIEF COMPLAINT:  Chief Complaint  Patient presents with  . Follow-up    RM 12, alone. Complaining of pressure in his head.    HISTORY OF PRESENT ILLNESS:  [name]  is a 45 year old man who has had pressure like headaches off/om for a few years and daily since January 2021.  Pain worsened over the last couple months of 2021.  He continues to experience a pressure-like sensation in the head.    He has noted more issues with vision as well.     He is from [place] and symptoms started while he was there.  He had an MRI in [place] that reportedly showed foci in the brain.    He repeated the MRI 03/04/2020 and it did show multiple punctate subcortical foci that could be due to chronic microvascular ischemic changes, migraine or sequela cardio emboli..    The head pressure sensations are similar to last month.  Gabapentin has not helped much.  Currently, taking 200 mg po tid.     Because of the appearance of the foci on brain MRI, and echocardiogram with bubble contrast was ordered.  It showed a secundum atrial septal defect with left-to-right shunting.  Agitated saline contrast bubble study was positive with shunting observed within 3-6 cardiac cycles.  Left ventricle had hyperdynamic function.  The mitral valve leaflets were mildly thickened.  The aortic valve was bicuspid and mildly thickened.  He will see cardiology later this week.  Vascular risks:  No Hypertension.  He has had glucose that is slightly elevated at times and tild he could be pre-diabetic.   He does not smoke (rare social smoker many years ago).   No cardiovascular issues but he has occasional palpitations.   He had a DVT and is on  Xarelto.  This was diagnosed about 3 to 4 months ago.   Laboratory tests 03/11/2020: ANA, ESR, ANCA and homocysteine were normal or negative  Imaging review: MRI 03/04/2020 showed T2/FLAIR hyperintense foci predominantly in the subcortical and deep white matter.  Sagittal images did not show foci in the corpus callosum or callosal septal fibers.  There were no acute findings.  Normal enhancement pattern.  The findings are most consistent with chronic microvascular ischemic change or even sequela of migraine headache.  Demyelination or vasculitis would be less likely to have this pattern.  CT 70488 21 shows mild hypodense foci predominantly in the left parietal lobe subcortical white matter.  REVIEW OF SYSTEMS: Constitutional: No fevers, chills, sweats, or change in appetite Eyes: He notes blurry vision. Ear, nose and throat: No hearing loss, ear pain, nasal congestion, sore throat Cardiovascular: No chest pain, palpitations Respiratory: No shortness of breath at rest or with exertion.   No wheezes GastrointestinaI: No nausea, vomiting, diarrhea, abdominal pain, fecal incontinence Genitourinary: No dysuria, urinary retention or frequency.  No nocturia. Musculoskeletal: No neck pain, back pain Integumentary: No rash, pruritus, skin lesions Neurological: as above Psychiatric: No depression at this time.  No anxiety Endocrine: No palpitations, diaphoresis, change in appetite, change in weigh or increased thirst Hematologic/Lymphatic: No anemia, purpura, petechiae. Allergic/Immunologic: No itchy/runny eyes, nasal congestion, recent allergic reactions, rashes  ALLERGIES: Allergies  Allergen Reactions  . Ivp Dye [Iodinated Diagnostic Agents]     HOME  MEDICATIONS:  Current Outpatient Medications:  Marland Kitchen  GABAPENTIN PO, Take 100 mg by mouth every 8 (eight) hours., Disp: , Rfl:  .  metFORMIN (GLUCOPHAGE) 500 MG tablet, Take 500 mg by mouth daily with breakfast., Disp: , Rfl:  .  Rivaroxaban  (XARELTO PO), Take 15 mg by mouth daily., Disp: , Rfl:   PAST MEDICAL HISTORY: Past Medical History:  Diagnosis Date  . Diabetes mellitus without complication ([place])   . DVT (deep venous thrombosis) ([place]) 11/2019    PAST SURGICAL HISTORY: Past Surgical History:  Procedure Laterality Date  . NO PAST SURGERIES      FAMILY HISTORY: No family history on file.  SOCIAL HISTORY:  Social History   Socioeconomic History  . Marital status: Married    Spouse name: Not on file  . Number of children: Not on file  . Years of education: Not on file  . Highest education level: Not on file  Occupational History  . Not on file  Tobacco Use  . Smoking status: Never Smoker  . Smokeless tobacco: Never Used  Substance and Sexual Activity  . Alcohol use: Not Currently  . Drug use: Not Currently  . Sexual activity: Yes  Other Topics Concern  . Not on file  Social History Narrative   Right handed   Tea   Social Determinants of Health   Financial Resource Strain: Not on file  Food Insecurity: Not on file  Transportation Needs: Not on file  Physical Activity: Not on file  Stress: Not on file  Social Connections: Not on file  Intimate Partner Violence: Not on file     PHYSICAL EXAM  Vitals:   04/06/20 1438  BP: 111/69  Pulse: 77  Weight: 147 lb (66.7 kg)  Height: 5' 9.5" (1.765 m)    Body mass index is 21.4 kg/m.  No exam data present  General: The patient is well-developed and well-nourished and in no acute distress  HEENT:  Head is [place]/AT.  Sclera are anicteric.  Funduscopic exam shows normal optic discs and retinal vessels.  Neck: No carotid bruits are noted.  The neck is nontender.  Cardiovascular: The heart has a regular rate and rhythm with a normal S1 and S2. There were no murmurs, gallops or rubs.    Skin: Extremities are without rash or  edema.  Musculoskeletal:  Back is nontender  Neurologic Exam  Mental status: The patient is alert and oriented x 3 at  the time of the examination. The patient has apparent normal recent and remote memory, with an apparently normal attention span and concentration ability.   Speech is normal.  Cranial nerves: Extraocular movements are full. Pupils are equal, round, and reactive to light and accomodation. Color visio.  Facial symmetry is present. There is good facial sensation to soft touch bilaterally.Facial strength is normal.  Trapezius and sternocleidomastoid strength is normal. No dysarthria is noted.  The tongue is midline, and the patient has symmetric elevation of the soft palate. No obvious hearing deficits are noted.  Motor:  Muscle bulk is normal.   Tone is normal. Strength is  5 / 5 in all 4 extremities.   Sensory: Sensory testing is intact to pinprick, soft touch and vibration sensation in all 4 extremities.  Coordination: Cerebellar testing reveals good finger-nose-finger and heel-to-shin bilaterally.  Gait and station: Station is normal.   Gait is normal. Tandem gait is normal. Romberg is negative.   Reflexes: Deep tendon reflexes are symmetric and normal bilaterally.  DIAGNOSTIC DATA (LABS, IMAGING, TESTING) - I reviewed patient records, labs, notes, testing and imaging myself where available.  Lab Results  Component Value Date   WBC 4.7 03/03/2020   HGB 16.0 03/03/2020   HCT 47.0 03/03/2020   MCV 85.6 03/03/2020   PLT 278 03/03/2020      Component Value Date/Time   NA 139 03/03/2020 2216   K 3.8 03/03/2020 2216   CL 101 03/03/2020 2216   CO2 26 03/03/2020 2144   GLUCOSE 155 (H) 03/03/2020 2216   BUN 19 03/03/2020 2216   CREATININE 1.00 03/03/2020 2216   CALCIUM 9.5 03/03/2020 2144   PROT 7.1 03/03/2020 2144   ALBUMIN 4.0 03/03/2020 2144   AST 14 (L) 03/03/2020 2144   ALT 16 03/03/2020 2144   ALKPHOS 70 03/03/2020 2144   BILITOT 1.3 (H) 03/03/2020 2144   GFRNONAA >60 03/03/2020 2144   GFRAA >60 09/13/2017 1729       ASSESSMENT AND PLAN  White matter abnormality  on MRI of brain  Atrial septal defect, secundum  History of DVT in adulthood  Vision disturbance  Pressure in head   1.   He has appt in a couple days with cardiology for the sASD.  He is already on Eliquis for DVT 2.   Continue to stay active.      3.   Continue gabapentin for now.   If not better, consider a tricyclic. 4.   He will try to get his MRI of the brain from [place].  If he is able to get this I will compare side-by-side. 5.   He will return to see me in 4 weeks or sooner if there are new or worsening neurologic symptoms.  Lameka Disla A. Felecia Shelling, MD, [facility] 2/94/7654, 6:50 PM Certified in Neurology, Clinical Neurophysiology, Sleep Medicine and Neuroimaging  [facility] [address], [place], [place] [address] [phone]

## 2020-04-09 ENCOUNTER — Ambulatory Visit: Payer: Self-pay | Admitting: Cardiology

## 2020-04-09 ENCOUNTER — Encounter: Payer: Self-pay | Admitting: Cardiology

## 2020-04-09 ENCOUNTER — Other Ambulatory Visit: Payer: Self-pay

## 2020-04-09 ENCOUNTER — Ambulatory Visit (INDEPENDENT_AMBULATORY_CARE_PROVIDER_SITE_OTHER): Payer: Self-pay | Admitting: Cardiology

## 2020-04-09 VITALS — BP 110/66 | HR 92 | Ht 69.5 in | Wt 151.6 lb

## 2020-04-09 DIAGNOSIS — Q211 Atrial septal defect: Secondary | ICD-10-CM

## 2020-04-09 DIAGNOSIS — Q2111 Secundum atrial septal defect: Secondary | ICD-10-CM

## 2020-04-09 DIAGNOSIS — I825Y2 Chronic embolism and thrombosis of unspecified deep veins of left proximal lower extremity: Secondary | ICD-10-CM

## 2020-04-09 NOTE — Progress Notes (Addendum)
Cardiology Office Note:    Date:  04/11/2020   ID:  Lucas Reyes, DOB [birth year], MRN [id number]  PCP:  System, Provider Not In  [facility] Cardiologist:  No primary care provider on file.  [facility] Electrophysiologist:  None   Referring MD: Asa Lente, MD    History of Present Illness:    Lucas Reyes is a 45 y.o. male with a hx of a hx of DMII, DVT on xarelto and bicuspid aortic valve who was referred by Dr. Epimenio Foot for further evaluation of possible cardiac source of emboli.  Patient was seen by Dr. Epimenio Foot on 03/09/20 for headaches, tingling and the sensation like his head "in a vice." He underwent MRI where he was noted to have WM changes with concern for chronic, microvascular ischemic changes or possible sequela of cardioemboli rather than demyelination. TTE with bubble was performed and there was concern for possible shunting, however, after thorough review of imaging, do not think there is a PFO or ASD.  Today, the patient states he continues to have head heaviness and tingling. He denies any chest pain, LE edema, orthopnea, or PND. No stroke symptoms (no weakness, dysarthria, vision changes). He has received a lot of his medical care in [place] and has just started being followed here for his neurologic symptoms. Of note, he has a history of what sounds like an unprovoked DVT now on xarelto that was started in [place]. He has not followed with someone in [place].  Past Medical History:  Diagnosis Date  . Diabetes mellitus without complication ([facility])   . DVT (deep venous thrombosis) ([facility]) 11/2019    Past Surgical History:  Procedure Laterality Date  . NO PAST SURGERIES      Current Medications: Current Meds  Medication Sig  . doxycycline (VIBRAMYCIN) 100 MG capsule Take 100 mg by mouth 2 (two) times daily. Take one tablet 100mg  by mouth every 12 hours for 10 days  . GABAPENTIN PO Take 100 mg by mouth every 8 (eight) hours.  . metFORMIN (GLUCOPHAGE) 500 MG tablet  Take 500 mg by mouth daily with breakfast.  . Rivaroxaban (XARELTO PO) Take 15 mg by mouth daily.     Allergies:   Ivp dye [iodinated diagnostic agents]   Social History   Socioeconomic History  . Marital status: Married    Spouse name: Not on file  . Number of children: Not on file  . Years of education: Not on file  . Highest education level: Not on file  Occupational History  . Not on file  Tobacco Use  . Smoking status: Never Smoker  . Smokeless tobacco: Never Used  Substance and Sexual Activity  . Alcohol use: Not Currently  . Drug use: Not Currently  . Sexual activity: Yes  Other Topics Concern  . Not on file  Social History Narrative   Right handed   Tea   Social Determinants of Health   Financial Resource Strain: Not on file  Food Insecurity: Not on file  Transportation Needs: Not on file  Physical Activity: Not on file  Stress: Not on file  Social Connections: Not on file     Family History: The patient's family history is not on file.  ROS:   Please see the history of present illness.    Review of Systems  Constitutional: Negative for chills, fever and malaise/fatigue.  HENT: Positive for congestion.   Eyes: Negative for blurred vision, double vision and photophobia.  Respiratory: Negative for shortness of breath.  Cardiovascular: Negative for chest pain, palpitations, orthopnea, claudication, leg swelling and PND.  Gastrointestinal: Negative for abdominal pain, blood in stool, heartburn, nausea and vomiting.  Genitourinary: Negative for hematuria.  Musculoskeletal: Positive for myalgias.  Neurological: Positive for tingling and headaches. Negative for focal weakness, seizures, loss of consciousness and weakness.  Psychiatric/Behavioral: The patient is nervous/anxious.     EKGs/Labs/Other Studies Reviewed:    The following studies were reviewed today: TTE 03-31-20: 1. The aortic valve is bicuspid. It is mildly thickened and calcified. No   aortic stenosis or regurgitation. Aortic root and ascending aorta are  normal in size. No evidence of coarctation.  2. Left ventricular ejection fraction, by estimation, is >75%. The left  ventricle has hyperdynamic function. The left ventricle has no regional  wall motion abnormalities. Left ventricular diastolic parameters were  normal.  3. Right ventricular systolic function is normal. The right ventricular  size is normal.  4. The mitral valve leaflets are mildly thickened. Trivial mitral valve  regurgitation.  5. The aortic valve is bicuspid. There is mild thickening of the aortic  valve. Aortic valve regurgitation is not visualized.  6. The inferior vena cava is normal in size with greater than 50%  respiratory variability, suggesting right atrial pressure of 3 mmHg.  7. After re-review of agitated saline bubble study and color doppler  interrogation, low suspicion for PFO or ASD. Can consider TEE for further  evaluation if there is evidence of cardioembolic source of emboli.   MRI 03/04/2020 showed T2/FLAIR hyperintense foci predominantly in the subcortical and deep white matter. Sagittal images did not show foci in the corpus callosum or callosal septal fibers. There were no acute findings. Normal enhancement pattern. The findings are most consistent with chronic microvascular ischemic change or even sequela of migraine headache. Demyelination or vasculitis would be less likely to have this pattern.  CT 03/03/20 shows mild hypodense foci predominantly in the left parietal lobe subcortical white matter.   Recent Labs: 03/03/2020: ALT 16; BUN 19; Creatinine, Ser 1.00; Hemoglobin 16.0; Platelets 278; Potassium 3.8; Sodium 139  Recent Lipid Panel No results found for: CHOL, TRIG, HDL, CHOLHDL, VLDL, LDLCALC, LDLDIRECT    Physical Exam:    VS:  BP 110/66   Pulse 92   Ht 5' 9.5" (1.765 m)   Wt 151 lb 9.6 oz (68.8 kg)   SpO2 97%   BMI 22.07 kg/m     Wt Readings  from Last 3 Encounters:  04/09/20 151 lb 9.6 oz (68.8 kg)  04/06/20 147 lb (66.7 kg)  03/09/20 147 lb (66.7 kg)     GEN:  Well nourished, well developed in no acute distress HEENT: Normal NECK: No JVD; No carotid bruits CARDIAC: RRR, 2/6 systolic murmur, rubs, gallops RESPIRATORY:  Clear to auscultation without rales, wheezing or rhonchi  ABDOMEN: Soft, non-tender, non-distended MUSCULOSKELETAL:  No edema; No deformity  SKIN: Warm and dry NEUROLOGIC:  Alert and oriented x 3 PSYCHIATRIC:  Normal affect   ASSESSMENT:    1. Atrial septal defect, secundum   2. Chronic deep vein thrombosis (DVT) of proximal vein of left lower extremity (HCC)    PLAN:    In order of problems listed above:  #Possible interatrial shunt: MRI head suggestive of possible cardioembolic WM changes. Reviewed images of TTE at length  and do not think there is a significant PFO or ASD. Not having typical stroke like symptoms and I do not suspect his head tingling/hedaches are due to interatrial/symptomatic cardioembolic phenomena. Despite this,  if there is evidence of cardioembolic vascular events on MRI, may want to obtain TEE or transcranial doppler for further work-up. This may be difficult as the patient is uninsured and awaiting possible Medicaid. -Recommend TEE vs transcranial doppler but patient is uninsured; awaiting medicaid coverage -Continue xarelto for DVT  #Bicuspid Aortic Valve: Noted on TTE with no significant stenosis or regurgitation. Ascending aorta and aortic root normal size. -Will need serial monitoring with TTE/CT scans of aorta -Family screening  #DVT: Sounds unprovoked. Denied preceding periods of immobility, malignancy, injury etc. Was placed on AC in [place]. No one managing here. Currently taking xarelto every other day. -Continue xarelto (increase to daily dosing) for planned 6 month course (planned stop date Feb) -Once insured, can consider Heme referral for further  management  Over of time spent discussing options with the patient as detailed below. He is amenable to the plan and anxious to have this worked up further.   Medication Adjustments/Labs and Tests Ordered: Current medicines are reviewed at length with the patient today.  Concerns regarding medicines are outlined above.  No orders of the defined types were placed in this encounter.  No orders of the defined types were placed in this encounter.   Patient Instructions  Medication Instructions:  Your physician recommends that you continue on your current medications as directed. Please refer to the Current Medication list given to you today.  *If you need a refill on your cardiac medications before your next appointment, please call your pharmacy*   Lab Work: None If you have labs (blood work) drawn today and your tests are completely normal, you will receive your results only by: Marland Kitchen MyChart Message (if you have MyChart) OR . A paper copy in the mail If you have any lab test that is abnormal or we need to change your treatment, we will call you to review the results.   Testing/Procedures: None   Follow-Up: At [facility], you and your health needs are our priority.  As part of our continuing mission to provide you with exceptional heart care, we have created designated Provider Care Teams.  These Care Teams include your primary Cardiologist (physician) and Advanced Practice Providers (APPs -  Physician Assistants and Nurse Practitioners) who all work together to provide you with the care you need, when you need it.  We recommend signing up for the patient portal called "MyChart".  Sign up information is provided on this After Visit Summary.  MyChart is used to connect with patients for Virtual Visits (Telemedicine).  Patients are able to view lab/test results, encounter notes, upcoming appointments, etc.  Non-urgent messages can be sent to your provider as well.   To learn more  about what you can do with MyChart, go to [email or web address].    Your next appointment:   6 week(s)  The format for your next appointment:   In Person  Provider:   You may see Laurance Flatten, MD or one of the following Advanced Practice Providers on your designated Care Team:    Tereso Newcomer, PA-C  Chelsea Aus, [place]    Other Instructions  You have been referred to Dr. Excell Seltzer to discuss ASD.      Signed, Meriam Sprague, MD  04/11/2020 8:16 AM    [facility]

## 2020-04-09 NOTE — Patient Instructions (Signed)
Medication Instructions:  Your physician recommends that you continue on your current medications as directed. Please refer to the Current Medication list given to you today.  *If you need a refill on your cardiac medications before your next appointment, please call your pharmacy*   Lab Work: None If you have labs (blood work) drawn today and your tests are completely normal, you will receive your results only by: Marland Kitchen MyChart Message (if you have MyChart) OR . A paper copy in the mail If you have any lab test that is abnormal or we need to change your treatment, we will call you to review the results.   Testing/Procedures: None   Follow-Up: At [facility], you and your health needs are our priority.  As part of our continuing mission to provide you with exceptional heart care, we have created designated Provider Care Teams.  These Care Teams include your primary Cardiologist (physician) and Advanced Practice Providers (APPs -  Physician Assistants and Nurse Practitioners) who all work together to provide you with the care you need, when you need it.  We recommend signing up for the patient portal called "MyChart".  Sign up information is provided on this After Visit Summary.  MyChart is used to connect with patients for Virtual Visits (Telemedicine).  Patients are able to view lab/test results, encounter notes, upcoming appointments, etc.  Non-urgent messages can be sent to your provider as well.   To learn more about what you can do with MyChart, go to [email or web address].    Your next appointment:   6 week(s)  The format for your next appointment:   In Person  Provider:   You may see Laurance Flatten, MD or one of the following Advanced Practice Providers on your designated Care Team:    Tereso Newcomer, PA-C  Chelsea Aus, [place]    Other Instructions  You have been referred to Dr. Excell Seltzer to discuss ASD.

## 2020-04-10 LAB — ECHOCARDIOGRAM COMPLETE BUBBLE STUDY
Area-P 1/2: 3.85 cm2
S' Lateral: 2.4 cm

## 2020-04-10 NOTE — Telephone Encounter (Addendum)
-----   Message from Meriam Sprague, MD sent at 04/10/2020  8:26 AM EST ----- I just looked at his TTE images with Dr. Excell Seltzer and do not think he has a convincing ASD. Can we cancel his follow-up with Excell Seltzer and have him just follow with me? We can still arrange for TEE in the future to assess further if needed, but we will try to consolidate his costs as he is uninsured and does not need to see Dr. Excell Seltzer at this time.   Thank you!  Spoke with the pt and he is asking if Dr. Shari Prows can call him when she gets a chance.I sent her message to his My Chart so he can review with his family MD per his request. Pt has follow up with her 05/19/20.

## 2020-04-21 NOTE — Telephone Encounter (Signed)
Attempted to call number provided but went to VM.  Called and spoke to Lucas Reyes. Had episode of palpitations yesterday and went to see his physician who gave him an infusion of vitamins with improvement. States he has been hydrated with no excessive caffeine. No recent illnesses. Palpitations feel like a pounding in his chest. No associated chest pain or SOB. We discussed we can do a monitor at his next visit if they persist.   He also states he is very stressed about the continued "pressure-like" sensation in his head that has been ongoing for months. Has difficulty sleeping. No history of HTN or acute intracranial pathology on MRI. Had the enhanced T2 flair signal ? dymelination vs small vessel ischemia, but not likely the source of his symptoms. Again, I do not think his symptoms are related to his heart. I understand his frustration as his symptoms have not improved. Discussed a trial of claritin vs zyrtec as maybe sinus congestion is contributing. He is amenable to trying. We need to establish a PCP for him for continued follow-up as well as possible follow-up with a headache specialist.  Laurance Flatten, MD

## 2020-04-22 ENCOUNTER — Telehealth: Payer: Self-pay | Admitting: Neurology

## 2020-04-22 NOTE — Telephone Encounter (Signed)
Hi Michele,  Can you call back and offer him 021/18/22 at 9am with Dr. Epimenio Foot? It is on hold

## 2020-04-22 NOTE — Telephone Encounter (Signed)
Kara Mead, I cancelled this patients 1/31 appt because he is waiting on a CD from [place] so he can get his meds if needed. He wanted to be rescheduled for February per Dr Epimenio Foot but the only appt I had open was 5/5. Can you put him in for February mid to end and I can call him to let him know if you need.  Thank you

## 2020-04-23 ENCOUNTER — Encounter: Payer: Self-pay | Admitting: Neurology

## 2020-04-23 ENCOUNTER — Telehealth: Payer: Self-pay | Admitting: Neurology

## 2020-04-23 ENCOUNTER — Other Ambulatory Visit: Payer: Self-pay | Admitting: Neurology

## 2020-04-23 LAB — HM DIABETES EYE EXAM

## 2020-04-23 MED ORDER — RIZATRIPTAN BENZOATE 5 MG PO TABS: ORAL_TABLET | ORAL | 1 refills | Status: AC

## 2020-04-23 MED ORDER — IMIPRAMINE HCL 10 MG PO TABS: ORAL_TABLET | ORAL | 3 refills | Status: DC

## 2020-04-23 NOTE — Telephone Encounter (Signed)
Lucas Reyes [name] and he said that he isn't feeling well and sent a My Chart message to Dr. Epimenio Foot today. I see the My Chart is open so not sure if you want to schedule after he speaks to the patient. Let me know how I can assist. Thank you

## 2020-04-23 NOTE — Telephone Encounter (Signed)
Called pt back. He is not feeling any better since he saw Dr. Epimenio Foot. Still having pressure in head. Unsure if it is a migraine. Since Monday, having severe pressure in head/eyes. He spoke with cardiologist who wants him to follow up with Dr. Epimenio Foot. He has not received MRI from [place] yet. Person who was supposed to bring it has been delayed in coming here. He should be arriving next week with this. Advised I will let Dr. Epimenio Foot know he called. He would like a call back from Dr. Epimenio Foot to discuss things.

## 2020-04-23 NOTE — Telephone Encounter (Signed)
I spoke to Lucas Reyes  He continues to have a pressure-like headache.  He does not have significant nausea and no vomiting.  There is just a little bit of photophobia.  He did not get benefit from gabapentin.  He has had difficulty tolerating medications so the dose was slowly increased to 200 mg 3 times daily.  Since he is not getting any benefit, we need to try a different medication.  I sent in a prescription for imipramine.  Since he has had issues with side effects medications in the past he would like a very small dose and to increase it from there.  Therefore, I sent in the prescription for 10 mg and he will take 1 at night for couple nights and then increase to 2 at night if he tolerates it.  Additionally, I sent in a prescription for rizatriptan 5 mg that he could take once a day as needed for headache not to exceed 3 days a week.    He can have a copy of his MRI sent from [facility] so I can compare side-by-side with the MRI he had in December.  That MRI showed nonspecific subcortical foci that did not appear to be acute and did not enhance.  Apparently, the MRI performed earlier in [place] has been abnormal but it is unclear if he has had progression or not.

## 2020-04-27 ENCOUNTER — Ambulatory Visit: Payer: Self-pay | Admitting: Neurology

## 2020-05-11 ENCOUNTER — Other Ambulatory Visit: Payer: Self-pay

## 2020-05-11 ENCOUNTER — Telehealth: Payer: Self-pay | Admitting: *Deleted

## 2020-05-11 ENCOUNTER — Ambulatory Visit: Payer: BC Managed Care – PPO | Admitting: Neurology

## 2020-05-11 ENCOUNTER — Encounter: Payer: Self-pay | Admitting: Neurology

## 2020-05-11 VITALS — BP 130/76 | HR 95 | Ht 69.5 in | Wt 153.0 lb

## 2020-05-11 DIAGNOSIS — Z86718 Personal history of other venous thrombosis and embolism: Secondary | ICD-10-CM

## 2020-05-11 DIAGNOSIS — R519 Headache, unspecified: Secondary | ICD-10-CM

## 2020-05-11 DIAGNOSIS — R410 Disorientation, unspecified: Secondary | ICD-10-CM | POA: Diagnosis not present

## 2020-05-11 DIAGNOSIS — R9082 White matter disease, unspecified: Secondary | ICD-10-CM | POA: Diagnosis not present

## 2020-05-11 NOTE — Progress Notes (Signed)
[facility]  PATIENT: Lucas Reyes DOB: [birth year]  REFERRING DOCTOR OR PCP:  Margarita Mail, PA-C; Dr. Melina Fiddler SOURCE: Patient, notes from emergency room, imaging and laboratory reports, MRI images personally reviewed.  _________________________________   HISTORICAL  CHIEF COMPLAINT:  Chief Complaint  Patient presents with  . Follow-up    RM 13, alone. Last seen 04/06/20. Brought MRI CD to be reviewed by Dr. Felecia Shelling and discuss next steps.    HISTORY OF PRESENT ILLNESS:  Update 05/11/2020 I personally reviewed his MRI from [place] 09/01/2017 and compared it to the [place] form 03/04/2020 [facility]).  The older MRI has coronal T2 weighted but no FLAIR images so comparison is difficult (could only compare T2 coronals between the two).    He has multiple T2 weighted foci in the subcortical white matter, maybe slightly progressed.   DWI and heme weighted were also done and were normal.     This morning when he woke up, he felt confused and was shivering.  He does note occasionally feeling more confused.Marland Kitchen  He is still feeling that way.     He had also neck that showed up on both spot urine a 24-hour urine analysis.  The results we have show a reading of 177 ug/g within normal range of less than 100.  I discussed with him that I do not think that that slight elevation would be enough to cause symptoms.  He has not had any GI issues.  He has switched to bottled water lately.   In 2019, he had a tingling sensation and had an MRi showing some T2 hyperintense white matter foci.     I spoke with his doctor back in [place] and we reviewed details of his case including the MRI comparison.  History: [name]  is a 45 year old man who has had pressure like headaches off/on for a few years and daily since January 2021.  Pain worsened over the last couple months of 2021.  He continues to experience a pressure-like sensation in the head.    He has noted more issues with vision as  well.     He is from [place] and symptoms started while he was there.  He had an MRI in [place] that showed foci in the brain in July 2019.    He repeated the MRI 03/04/2020 and it did show multiple punctate subcortical foci that could be due to chronic microvascular ischemic changes, migraine or sequela cardio emboli..  The head pressure sensations are similar to last month.  Gabapentin has not helped much.  Currently, taking 200 mg po tid.     Because of the appearance of the foci on brain MRI, and echocardiogram with bubble contrast was ordered.  It showed a secundum atrial septal defect with left-to-right shunting.  Agitated saline contrast bubble study was positive with shunting observed within 3-6 cardiac cycles.  Left ventricle had hyperdynamic function.  The mitral valve leaflets were mildly thickened.  The aortic valve was bicuspid and mildly thickened.  Vascular risks:  No Hypertension.  He has had glucose that is slightly elevated at times and tild he could be pre-diabetic.   He does not smoke (rare social smoker many years ago).   No cardiovascular issues but he has occasional palpitations.   He had a DVT and is on Xarelto.  This was diagnosed about 3 to 4 months ago.   Laboratory tests 03/11/2020: ANA, ESR, ANCA and homocysteine were normal or negative  Imaging review: MRI  03/04/2020 showed T2/FLAIR hyperintense foci predominantly in the subcortical and deep white matter.  Sagittal images did not show foci in the corpus callosum or callosal septal fibers.  There were no acute findings.  Normal enhancement pattern.  The findings are most consistent with chronic microvascular ischemic change or even sequela of migraine headache.  Demyelination or vasculitis would be less likely to have this pattern.  I was able to compare with his previous MRI from 2019 and there were no definite changes.  MRI 10/14/2017 showed multiple T2 hyperintense foci in the subcortical white matter (there was no axial FLAIR but  T2 coronal images could be compared with the 2021 MRI). CT 49[address] 21 shows mild hypodense foci predominantly in the left parietal lobe subcortical white matter.  REVIEW OF SYSTEMS: Constitutional: No fevers, chills, sweats, or change in appetite Eyes: He notes blurry vision. Ear, nose and throat: No hearing loss, ear pain, nasal congestion, sore throat Cardiovascular: No chest pain, palpitations Respiratory: No shortness of breath at rest or with exertion.   No wheezes GastrointestinaI: No nausea, vomiting, diarrhea, abdominal pain, fecal incontinence Genitourinary: No dysuria, urinary retention or frequency.  No nocturia. Musculoskeletal: No neck pain, back pain Integumentary: No rash, pruritus, skin lesions Neurological: as above Psychiatric: No depression at this time.  No anxiety Endocrine: No palpitations, diaphoresis, change in appetite, change in weigh or increased thirst Hematologic/Lymphatic: No anemia, purpura, petechiae. Allergic/Immunologic: No itchy/runny eyes, nasal congestion, recent allergic reactions, rashes  ALLERGIES: Allergies  Allergen Reactions  . Ivp Dye [Iodinated Diagnostic Agents]     HOME MEDICATIONS:  Current Outpatient Medications:  Marland Kitchen  GABAPENTIN PO, Take 100 mg by mouth every 8 (eight) hours., Disp: , Rfl:  .  imipramine (TOFRANIL) 10 MG tablet, Take one or two about one hour before bedtime, Disp: 60 tablet, Rfl: 3 .  metFORMIN (GLUCOPHAGE) 500 MG tablet, Take 500 mg by mouth daily with breakfast., Disp: , Rfl:  .  Rivaroxaban (XARELTO PO), Take 15 mg by mouth daily., Disp: , Rfl:  .  rizatriptan (MAXALT) 5 MG tablet, One po prn headache.  No more than one a day or 3 a week, Disp: 10 tablet, Rfl: 1  PAST MEDICAL HISTORY: Past Medical History:  Diagnosis Date  . Diabetes mellitus without complication ([place])   . DVT (deep venous thrombosis) ([place]) 11/2019    PAST SURGICAL HISTORY: Past Surgical History:  Procedure Laterality Date  . NO PAST  SURGERIES      FAMILY HISTORY: No family history on file.  SOCIAL HISTORY:  Social History   Socioeconomic History  . Marital status: Married    Spouse name: Not on file  . Number of children: Not on file  . Years of education: Not on file  . Highest education level: Not on file  Occupational History  . Not on file  Tobacco Use  . Smoking status: Never Smoker  . Smokeless tobacco: Never Used  Substance and Sexual Activity  . Alcohol use: Not Currently  . Drug use: Not Currently  . Sexual activity: Yes  Other Topics Concern  . Not on file  Social History Narrative   Right handed   Tea   Social Determinants of Health   Financial Resource Strain: Not on file  Food Insecurity: Not on file  Transportation Needs: Not on file  Physical Activity: Not on file  Stress: Not on file  Social Connections: Not on file  Intimate Partner Violence: Not on file     PHYSICAL EXAM  Vitals:   05/11/20 1542  BP: 130/76  Pulse: 95  Weight: 153 lb (69.4 kg)  Height: 5' 9.5" (1.765 m)    Body mass index is 22.27 kg/m.  No exam data present  General: The patient is well-developed and well-nourished and in no acute distress  HEENT:  Head is [place]/AT.  Sclera are anicteric.  Funduscopic exam shows normal optic discs and retinal vessels.  Neck: No carotid bruits are noted.  The neck is nontender.  Cardiovascular: The heart has a regular rate and rhythm with a normal S1 and S2. There were no murmurs, gallops or rubs.    Skin: Extremities are without rash or  edema.  Musculoskeletal:  Back is nontender  Neurologic Exam  Mental status: The patient is alert and oriented x 3 at the time of the examination. The patient has apparent normal recent and remote memory, with an apparently normal attention span and concentration ability.   Speech is normal.  Cranial nerves: Extraocular movements are full. Pupils are equal, round, and reactive to light and accomodation. Color visio.   Facial symmetry is present. There is good facial sensation to soft touch bilaterally.Facial strength is normal.  Trapezius and sternocleidomastoid strength is normal. No dysarthria is noted.  The tongue is midline, and the patient has symmetric elevation of the soft palate. No obvious hearing deficits are noted.  Motor:  Muscle bulk is normal.   Tone is normal. Strength is  5 / 5 in all 4 extremities.   Sensory: Sensory testing is intact to pinprick, soft touch and vibration sensation in all 4 extremities.  Coordination: Cerebellar testing reveals good finger-nose-finger and heel-to-shin bilaterally.  Gait and station: Station is normal.   Gait is normal. Tandem gait is normal. Romberg is negative.   Reflexes: Deep tendon reflexes are symmetric and normal bilaterally.       DIAGNOSTIC DATA (LABS, IMAGING, TESTING) - I reviewed patient records, labs, notes, testing and imaging myself where available.  Lab Results  Component Value Date   WBC 4.7 03/03/2020   HGB 16.0 03/03/2020   HCT 47.0 03/03/2020   MCV 85.6 03/03/2020   PLT 278 03/03/2020      Component Value Date/Time   NA 139 03/03/2020 2216   K 3.8 03/03/2020 2216   CL 101 03/03/2020 2216   CO2 26 03/03/2020 2144   GLUCOSE 155 (H) 03/03/2020 2216   BUN 19 03/03/2020 2216   CREATININE 1.00 03/03/2020 2216   CALCIUM 9.5 03/03/2020 2144   PROT 7.1 03/03/2020 2144   ALBUMIN 4.0 03/03/2020 2144   AST 14 (L) 03/03/2020 2144   ALT 16 03/03/2020 2144   ALKPHOS 70 03/03/2020 2144   BILITOT 1.3 (H) 03/03/2020 2144   GFRNONAA >60 03/03/2020 2144   GFRAA >60 09/13/2017 1729       ASSESSMENT AND PLAN  White matter abnormality on MRI of brain  History of DVT in adulthood  Pressure in head  Confusion   1.   I compared the MRIs between 2019 and 2021 side-by-side.  There are no definite changes during the 2+ year interim.  These most likely represent chronic microvascular ischemic changes. 2.   Imipramine 10 mg nightly  and increase to 20 mg.  Hopefully this will help more than the gabapentin.   3.  Although he has noted some confusion, interaction today was normal.   4.   The arsenic was elevated in a urine test.  I do not think that the elevation is high enough  to cause any neurologic symptoms.  Additionally there were no GI symptoms that would be more typical.  As arsenic is eliminated fairly rapidly no chelation therapy is needed.   4.   He will return to see me in 6 weeks or sooner if there are new or worsening neurologic symptoms.  Vega Withrow A. Felecia Shelling, MD, [facility] 5/70/2202, 6:69 PM Certified in Neurology, Clinical Neurophysiology, Sleep Medicine and Neuroimaging  [facility] [address] [place], [place] [address] [phone]

## 2020-05-11 NOTE — Telephone Encounter (Signed)
Received message from [place] that pt called wanting to drop off MRI CD for Dr. Epimenio Foot to review and wants to speak with him when he comes. I called pt. Scheduled work in appt (ok per MD) for him to see Dr. Epimenio Foot at 4pm today, check in 330pm. He will bring MRI CD with him for Dr. Epimenio Foot to review prior to speaking with him.

## 2020-05-12 ENCOUNTER — Other Ambulatory Visit: Payer: Self-pay | Admitting: *Deleted

## 2020-05-12 ENCOUNTER — Telehealth: Payer: Self-pay | Admitting: Neurology

## 2020-05-12 DIAGNOSIS — R404 Transient alteration of awareness: Secondary | ICD-10-CM

## 2020-05-12 NOTE — Telephone Encounter (Signed)
Lucas Reyes is checking to see if we can get pt scheduled at the hospital this week for EEG.

## 2020-05-12 NOTE — Telephone Encounter (Signed)
Lucas Reyes called me back from [facility] . Patient is scheduled for 05/14/2020 arrive at 9:15 for 9:30  . I have explained all details to patient with where he is to go . Relayed to Patient I  Would send him a MY- Chart message as well with Details .   Thanks Annabelle Harman

## 2020-05-12 NOTE — Telephone Encounter (Signed)
Is possible the hospital could do the EEG quicker though I am not sure.  Please check to see if they can.    If they cannot, then he will need to get this done as an outpatient when he returns

## 2020-05-12 NOTE — Telephone Encounter (Signed)
Called pt to schedule EEG, he states he will be leaving the state and needs the test done before Saturday. Our next opening is not until 03/09. Pt also states he had questions for the nurse that he did not think of until after he left yesterday. Please advise.

## 2020-05-12 NOTE — Telephone Encounter (Signed)
Dr. Sater- how would you like to handle this? 

## 2020-05-12 NOTE — Telephone Encounter (Signed)
FYI

## 2020-05-14 ENCOUNTER — Ambulatory Visit (HOSPITAL_COMMUNITY)
Admission: RE | Admit: 2020-05-14 | Discharge: 2020-05-14 | Disposition: A | Discharge status: Home / Self Care | Payer: BC Managed Care – PPO | Source: Ambulatory Visit | Attending: Neurology | Admitting: Neurology

## 2020-05-14 ENCOUNTER — Other Ambulatory Visit: Payer: Self-pay

## 2020-05-14 DIAGNOSIS — R410 Disorientation, unspecified: Secondary | ICD-10-CM

## 2020-05-14 DIAGNOSIS — R569 Unspecified convulsions: Secondary | ICD-10-CM | POA: Diagnosis not present

## 2020-05-14 NOTE — Procedures (Signed)
Patient Name: Lucas Reyes  MRN: [id number]  Epilepsy Attending: Charlsie Quest  Referring Physician/Provider: Dr. Despina Arias Date: 05/14/2020  Duration: 23.53 mins  Patient history: 45 year old male with episodes of transient confusion.  EEG to evaluate for seizures.  Level of alertness: Awake, asleep  AEDs during EEG study: Gabapentin  Technical aspects: This EEG study was done with scalp electrodes positioned according to the 10-20 International system of electrode placement. Electrical activity was acquired at a sampling rate of 500Hz  and reviewed with a high frequency filter of 70Hz  and a low frequency filter of 1Hz . EEG data were recorded continuously and digitally stored.   Description: The posterior dominant rhythm consists of 11 Hz activity of moderate voltage (25-35 uV) seen predominantly in posterior head regions, symmetric and reactive to eye opening and eye closing. Sleep was characterized by vertex waves, sleep spindles (12 to 14 Hz), maximal frontocentral region. Hyperventilation did not show any EEG change. Physiologic photic driving was seen during photic stimulation.    IMPRESSION: This study is within normal limits. No seizures or epileptiform discharges were seen throughout the recording.  Josias Tomerlin 

## 2020-05-14 NOTE — Progress Notes (Signed)
OP EEG completed, results pending. 

## 2020-05-17 NOTE — Progress Notes (Signed)
Cardiology Office Note:    Date:  05/19/2020   ID:  Lucas Reyes, DOB Aug 09, 1975, MRN [id number]  PCP:  System, Provider Not In   [facility]  Cardiologist:  No primary care provider on file.  Advanced Practice Provider:  No care team member to display Electrophysiologist:  None    Referring MD: No ref. provider found    History of Present Illness:    Lucas Reyes is a 45 y.o. male with a hx of DMII, DVT on xarelto and bicuspid aortic valve who presents to clinic for follow-up.  Patient was initially seen by Dr. Epimenio Foot on 03/09/20 for headaches, tingling and the sensation like his head "in a vice." He underwent MRI where he was noted to have WM changes with concern for chronic, microvascular ischemic changes or possible sequela of cardioembolic rather than demyelination. TTE with bubble was performed and there was concern for possible shunting, however, after thorough review of imaging, do not think there is a PFO or ASD.  The patient was recommended for possible TEE, however, he does not have insurance and given low suspicion for PFO/ASD, it was deferred. Since our last visit, the patient has followed up with neuro. Recent MRI was compared to MRI from 2019 and was consistent with prior with no significant changes and thought to represent chronic microvascular ischemic changes. He also was told he had elevated arsenic levels in his urine but these were not thought to be driving his symptoms. Underwent EEG which was normal.   He now presents to clinic for follow-up. He is still having significant headaches and feels confused. Has stopped the xarelto by [place] as he wants to obtain lab tests off the medication. These have not yet been completed. His xarelto course is supposed to continue until the end of March. He is also receiving vitamin infusions with his physician to help his symptoms.   Past Medical History:  Diagnosis Date  . Diabetes mellitus without  complication (HCC)   . DVT (deep venous thrombosis) (HCC) 11/2019    Past Surgical History:  Procedure Laterality Date  . NO PAST SURGERIES      Current Medications: Current Meds  Medication Sig  . gabapentin (NEURONTIN) 300 MG capsule Take 300 mg by mouth 3 (three) times daily.  Marland Kitchen imipramine (TOFRANIL) 10 MG tablet Take one or two about one hour before bedtime  . metFORMIN (GLUCOPHAGE) 500 MG tablet Take 500 mg by mouth daily with breakfast.  . Rivaroxaban (XARELTO PO) Take 15 mg by mouth daily.  . rizatriptan (MAXALT) 5 MG tablet One po prn headache.  No more than one a day or 3 a week     Allergies:   Ivp dye [iodinated diagnostic agents]   Social History   Socioeconomic History  . Marital status: Married    Spouse name: Not on file  . Number of children: Not on file  . Years of education: Not on file  . Highest education level: Not on file  Occupational History  . Not on file  Tobacco Use  . Smoking status: Never Smoker  . Smokeless tobacco: Never Used  Substance and Sexual Activity  . Alcohol use: Not Currently  . Drug use: Not Currently  . Sexual activity: Yes  Other Topics Concern  . Not on file  Social History Narrative   Right handed   Tea   Social Determinants of Health   Financial Resource Strain: Not on file  Food Insecurity: Not on file  Transportation Needs: Not on file  Physical Activity: Not on file  Stress: Not on file  Social Connections: Not on file     Family History: The patient's family history is not on file.  ROS:   Please see the history of present illness.    Review of Systems  Constitutional: Positive for malaise/fatigue.  HENT: Negative for hearing loss and sore throat.   Eyes: Negative for blurred vision.  Respiratory: Negative for shortness of breath.   Cardiovascular: Negative for chest pain, palpitations, orthopnea, claudication, leg swelling and PND.  Gastrointestinal: Negative for nausea and vomiting.  Genitourinary:  Negative for hematuria.  Musculoskeletal: Positive for myalgias. Negative for falls.  Neurological: Positive for tingling, sensory change and headaches. Negative for loss of consciousness.  Endo/Heme/Allergies: Negative for polydipsia.  Psychiatric/Behavioral: Negative for substance abuse.    EKGs/Labs/Other Studies Reviewed:    The following studies were reviewed today: TTE 03/17/20: 1. The aortic valve is bicuspid. It is mildly thickened and calcified. No  aortic stenosis or regurgitation. Aortic root and ascending aorta are  normal in size. No evidence of coarctation.  2. Left ventricular ejection fraction, by estimation, is >75%. The left  ventricle has hyperdynamic function. The left ventricle has no regional  wall motion abnormalities. Left ventricular diastolic parameters were  normal.  3. Right ventricular systolic function is normal. The right ventricular  size is normal.  4. The mitral valve leaflets are mildly thickened. Trivial mitral valve  regurgitation.  5. The aortic valve is bicuspid. There is mild thickening of the aortic  valve. Aortic valve regurgitation is not visualized.  6. The inferior vena cava is normal in size with greater than 50%  respiratory variability, suggesting right atrial pressure of 3 mmHg.  7. After re-review of agitated saline bubble study and color doppler  interrogation, low suspicion for PFO or ASD. Can consider TEE for further  evaluation if there is evidence of cardioembolic source of emboli.   Comparison(s): No prior Echocardiogram.   Recent Labs: 03/03/2020: ALT 16; BUN 19; Creatinine, Ser 1.00; Hemoglobin 16.0; Platelets 278; Potassium 3.8; Sodium 139  Recent Lipid Panel No results found for: CHOL, TRIG, HDL, CHOLHDL, VLDL, LDLCALC, LDLDIRECT    Physical Exam:    VS:  BP 120/70   Pulse (!) 107   Ht 5\' 6"  (1.676 m)   Wt 150 lb (68 kg)   SpO2 98%   BMI 24.21 kg/m     Wt Readings from Last 3 Encounters:  05/19/20 150  lb (68 kg)  05/11/20 153 lb (69.4 kg)  04/09/20 151 lb 9.6 oz (68.8 kg)     GEN:  Well nourished, well developed in no acute distress HEENT: Normal NECK: No JVD; No carotid bruits LYMPHATICS: No lymphadenopathy CARDIAC: Tachycardic, regular, no murmurs, rubs, gallops RESPIRATORY:  Clear to auscultation without rales, wheezing or rhonchi  ABDOMEN: Soft, non-tender, non-distended MUSCULOSKELETAL:  No edema; No deformity  SKIN: Warm and dry NEUROLOGIC:  Alert and oriented x 3 PSYCHIATRIC:  Anxious  ASSESSMENT:    1. Chronic deep vein thrombosis (DVT) of proximal vein of left lower extremity (HCC)   2. White matter abnormality on MRI of brain   3. Other headache syndrome   4. Bicuspid aortic valve    PLAN:    In order of problems listed above:  #Ischemic Changes on MRI: MRI head suggestive of possible cardioembolic WM changes. Reviewed images of TTE at length  and do not think there is a significant PFO or ASD.  Not having typical stroke like symptoms and I do not suspect his head tingling/hedaches are due to interatrial/symptomatic cardioembolic phenomena.  -Can consider TEE in the future however low suspicion that this is the primary driver of his symptoms; this has been emphasized to the patient multiple times -Will continue follow-up with Neuro for further management  #Pressure like Headache: Has been ongoing for years and is very distressing to the patient. Has had a very thorough work-up including MRI brain, EEG, extensive lab work, TTE with bubble which were notable for chronic ischemic changes on MRI as detailed above. Has been closely followed by Dr. Epimenio Foot with Neurology as above.  -Follows with Neurology -Continue imipramine  #Bicuspid Aortic Valve: Noted on TTE with no significant stenosis or regurgitation. Ascending aorta and aortic root normal size. -Will need serial monitoring with TTE/CT scans of aorta -Family screening  #DVT: Sounds unprovoked. Denied preceding  periods of immobility, malignancy, injury etc. Was placed on AC in [place]. Was planned for 6 month course xarelto however was stopped for testing by his other physician. Recommend resuming once lab testing complete. Will repeat LE doppler to ensure DVT has resolved as patient was previously taking xarelto every other day and has stopped it currently. If persistent DVT, will continue for 3 more months -Recommend resuming xarelto until the end of March for planned 6 month course -Will repeat LLE doppler ultrasound to ensure DVT resolved before stopping the [facility]   Medication Adjustments/Labs and Tests Ordered: Current medicines are reviewed at length with the patient today.  Concerns regarding medicines are outlined above.  Orders Placed This Encounter  Procedures  . VAS [place] LOWER EXTREMITY VENOUS (DVT)   No orders of the defined types were placed in this encounter.   Patient Instructions  Medication Instructions:  Your physician recommends that you continue on your current medications as directed. Please refer to the Current Medication list given to you today.  *If you need a refill on your cardiac medications before your next appointment, please call your pharmacy*   Lab Work: None ordered  If you have labs (blood work) drawn today and your tests are completely normal, you will receive your results only by: Marland Kitchen MyChart Message (if you have MyChart) OR . A paper copy in the mail If you have any lab test that is abnormal or we need to change your treatment, we will call you to review the results.   Testing/Procedures: Your physician has requested that you have a lower  extremity venous duplex. This test is an ultrasound of the veins in the legs or arms. It looks at venous blood flow that carries blood from the heart to the legs or arms. Allow one hour for a Lower Venous exam. Allow thirty minutes for an Upper Venous exam. There are no restrictions or special  instructions.     Follow-Up: At [facility], you and your health needs are our priority.  As part of our continuing mission to provide you with exceptional heart care, we have created designated Provider Care Teams.  These Care Teams include your primary Cardiologist (physician) and Advanced Practice Providers (APPs -  Physician Assistants and Nurse Practitioners) who all work together to provide you with the care you need, when you need it.  We recommend signing up for the patient portal called "MyChart".  Sign up information is provided on this After Visit Summary.  MyChart is used to connect with patients for Virtual Visits (Telemedicine).  Patients are able to view lab/test  results, encounter notes, upcoming appointments, etc.  Non-urgent messages can be sent to your provider as well.   To learn more about what you can do with MyChart, go to [email or web address].    Your next appointment:   6 month(s)  The format for your next appointment:   In Person  Provider:   Laurance Flatten, MD   Other Instructions      Signed, Meriam Sprague, MD  05/19/2020 4:59 PM    [facility]

## 2020-05-19 ENCOUNTER — Telehealth: Payer: Self-pay | Admitting: Cardiology

## 2020-05-19 ENCOUNTER — Telehealth: Payer: Self-pay

## 2020-05-19 ENCOUNTER — Encounter: Payer: Self-pay | Admitting: Cardiology

## 2020-05-19 ENCOUNTER — Ambulatory Visit (INDEPENDENT_AMBULATORY_CARE_PROVIDER_SITE_OTHER): Payer: BC Managed Care – PPO | Admitting: Cardiology

## 2020-05-19 ENCOUNTER — Other Ambulatory Visit: Payer: Self-pay

## 2020-05-19 VITALS — BP 120/70 | HR 107 | Ht 66.0 in | Wt 150.0 lb

## 2020-05-19 DIAGNOSIS — R9082 White matter disease, unspecified: Secondary | ICD-10-CM | POA: Diagnosis not present

## 2020-05-19 DIAGNOSIS — Q231 Congenital insufficiency of aortic valve: Secondary | ICD-10-CM | POA: Diagnosis not present

## 2020-05-19 DIAGNOSIS — I825Y2 Chronic embolism and thrombosis of unspecified deep veins of left proximal lower extremity: Secondary | ICD-10-CM

## 2020-05-19 DIAGNOSIS — G4489 Other headache syndrome: Secondary | ICD-10-CM

## 2020-05-19 NOTE — Telephone Encounter (Signed)
Pt called with questions regarding his appt this afternoon. He thought his MD had told his PCP he did not need to come to this appt. Per MD, there is no reason for him to not come. Pt understands and is unsure if he wants to keep the appt or cancel. He is aware there is a no show fee of $50.

## 2020-05-19 NOTE — Patient Instructions (Signed)
Medication Instructions:  Your physician recommends that you continue on your current medications as directed. Please refer to the Current Medication list given to you today.  *If you need a refill on your cardiac medications before your next appointment, please call your pharmacy*   Lab Work: None ordered  If you have labs (blood work) drawn today and your tests are completely normal, you will receive your results only by: Marland Kitchen MyChart Message (if you have MyChart) OR . A paper copy in the mail If you have any lab test that is abnormal or we need to change your treatment, we will call you to review the results.   Testing/Procedures: Your physician has requested that you have a lower  extremity venous duplex. This test is an ultrasound of the veins in the legs or arms. It looks at venous blood flow that carries blood from the heart to the legs or arms. Allow one hour for a Lower Venous exam. Allow thirty minutes for an Upper Venous exam. There are no restrictions or special instructions.     Follow-Up: At [facility], you and your health needs are our priority.  As part of our continuing mission to provide you with exceptional heart care, we have created designated Provider Care Teams.  These Care Teams include your primary Cardiologist (physician) and Advanced Practice Providers (APPs -  Physician Assistants and Nurse Practitioners) who all work together to provide you with the care you need, when you need it.  We recommend signing up for the patient portal called "MyChart".  Sign up information is provided on this After Visit Summary.  MyChart is used to connect with patients for Virtual Visits (Telemedicine).  Patients are able to view lab/test results, encounter notes, upcoming appointments, etc.  Non-urgent messages can be sent to your provider as well.   To learn more about what you can do with MyChart, go to [email or web address].    Your next appointment:   6 month(s)  The  format for your next appointment:   In Person  Provider:   Laurance Flatten, MD   Other Instructions

## 2020-05-19 NOTE — Telephone Encounter (Signed)
Lucas Reyes is calling stating his PCP told him Dr. Shari Prows advised him that she feels he needs to hold off on all upcoming appointments with our office, but he is still on the schedule for today. He is wanting to confirm he still needs to come in. Please advise.

## 2020-05-20 NOTE — Telephone Encounter (Signed)
I called pt to further discuss. He started imipramine on 05/16/20. Unsure if it is making sx worse or not. He complains of head pain/pressure. He is shivering/cannot sleep/feels confused. Denies any signs/sx of infection. Does not have thermometer but does not feel he has a fever. Wanting to know if he should continue imipramine or not. He saw heart doctor yesterday who told him to follow up with neurology.  I made him aware Dr. Epimenio Foot out of the office until next week. He would like message sent to [facility] to see if they have any recommendations. Advised I will send this to Dr. Marjory Lies and then contact him back with his response. He verbalized understanding.

## 2020-05-21 ENCOUNTER — Encounter (HOSPITAL_COMMUNITY): Payer: BC Managed Care – PPO

## 2020-05-22 ENCOUNTER — Ambulatory Visit (HOSPITAL_COMMUNITY)
Admission: RE | Admit: 2020-05-22 | Discharge: 2020-05-22 | Disposition: A | Discharge status: Home / Self Care | Payer: BC Managed Care – PPO | Source: Ambulatory Visit | Attending: Cardiology | Admitting: Cardiology

## 2020-05-22 ENCOUNTER — Other Ambulatory Visit: Payer: Self-pay

## 2020-05-22 DIAGNOSIS — I825Y2 Chronic embolism and thrombosis of unspecified deep veins of left proximal lower extremity: Secondary | ICD-10-CM

## 2020-05-25 ENCOUNTER — Telehealth: Payer: Self-pay

## 2020-05-25 DIAGNOSIS — I825Y2 Chronic embolism and thrombosis of unspecified deep veins of left proximal lower extremity: Secondary | ICD-10-CM

## 2020-05-25 NOTE — Telephone Encounter (Signed)
-----   Message from Meriam Sprague, MD sent at 05/25/2020  1:07 PM EST ----- His lower extremity doppler shows residual clot in his left leg. He really needs to restart and continue his xarelto uninterrupted for at least another 6 months. Can we also get him a referral to hematology who specialize in clotting disorders to see if he needs any further testing as to why he had the clot in the first place.

## 2020-05-25 NOTE — Telephone Encounter (Signed)
Attempted phone call to pt.  Left voicemail message to contact triage nurses at [phone].

## 2020-05-26 NOTE — Telephone Encounter (Signed)
Spoke with pt and advised per Dr Shari Prows [place] does show a residual clot in the left leg.  Dr Shari Prows advises pt to restart and continue Xarelto without interruption for at least 6 months.  Advised Dr Shari Prows would also like to refer pt to hematology for further evaluation.  Pt requests Dr Shari Prows contact Dr Nilsa Nutting at [phone] to discuss plan as Dr Nilsa Nutting stopped pt's Xarelto.  Pt will wait to restart Xarelto and declines referral until both doctors have spoken and agree on plan.   Will forward pt's request to Dr Shari Prows.  Pt thanked Rn for the call.

## 2020-05-26 NOTE — Telephone Encounter (Signed)
Pt is returning call.  

## 2020-05-26 NOTE — Telephone Encounter (Signed)
Follow Up:     Pt is calling back to get details about referral to the doctor that Dr Shari Prows is referring him.

## 2020-05-26 NOTE — Telephone Encounter (Signed)
Spoke with pt and explained what a Hematologist is.  Pt agreeable to go ahead and have referral placed.  Advised I will put that in and that office will be in contact to get him scheduled.  Pt appreciative for call.

## 2020-05-27 ENCOUNTER — Telehealth: Payer: Self-pay | Admitting: Oncology

## 2020-05-27 NOTE — Telephone Encounter (Signed)
Received a new hem referral from Dr. Shari Prows for chronic dvt. Mr. Grainger has been cld and scheduled to see Dr. Clelia Croft on 3/2 at 11am. Pt aware to arrive 30 minutes early.

## 2020-05-27 NOTE — Telephone Encounter (Signed)
Pt is scheduled for new pt appt with Hematology on 05/28/20 at 1100, with Dr. Clelia Croft. Pt made aware of appt date and time by [facility] scheduling.

## 2020-05-28 ENCOUNTER — Other Ambulatory Visit: Payer: Self-pay

## 2020-05-28 ENCOUNTER — Inpatient Hospital Stay: Payer: BC Managed Care – PPO | Attending: Oncology | Admitting: Oncology

## 2020-05-28 ENCOUNTER — Inpatient Hospital Stay: Payer: BC Managed Care – PPO

## 2020-05-28 VITALS — BP 134/80 | HR 95 | Temp 98.0°F | Resp 20 | Ht 66.0 in | Wt 151.7 lb

## 2020-05-28 DIAGNOSIS — Z79899 Other long term (current) drug therapy: Secondary | ICD-10-CM

## 2020-05-28 DIAGNOSIS — Z86718 Personal history of other venous thrombosis and embolism: Secondary | ICD-10-CM

## 2020-05-28 DIAGNOSIS — I82552 Chronic embolism and thrombosis of left peroneal vein: Secondary | ICD-10-CM | POA: Insufficient documentation

## 2020-05-28 DIAGNOSIS — R519 Headache, unspecified: Secondary | ICD-10-CM

## 2020-05-28 DIAGNOSIS — Z7984 Long term (current) use of oral hypoglycemic drugs: Secondary | ICD-10-CM

## 2020-05-28 DIAGNOSIS — I82532 Chronic embolism and thrombosis of left popliteal vein: Secondary | ICD-10-CM | POA: Insufficient documentation

## 2020-05-28 DIAGNOSIS — E119 Type 2 diabetes mellitus without complications: Secondary | ICD-10-CM

## 2020-05-28 DIAGNOSIS — Z7901 Long term (current) use of anticoagulants: Secondary | ICD-10-CM | POA: Insufficient documentation

## 2020-05-28 DIAGNOSIS — M109 Gout, unspecified: Secondary | ICD-10-CM | POA: Diagnosis not present

## 2020-05-28 DIAGNOSIS — Z79810 Long term (current) use of selective estrogen receptor modulators (SERMs): Secondary | ICD-10-CM

## 2020-05-28 LAB — ANTITHROMBIN III: AntiThromb III Func: 115 % (ref 75–120)

## 2020-05-28 NOTE — Progress Notes (Signed)
Reason for the request:    Deep vein thrombosis  HPI: I was asked by Dr. Shari Prows  to evaluate [name] for the evaluation of deep vein thrombosis.  He is a 45 year old man with history of diabetes and bicuspid aortic valve was diagnosed with deep vein thrombosis. His diagnosis was in September 2021 while staying in [place]. He denies any clear provoking symptoms at that time or international travel. He started developing symptoms of swelling in his leg and pain. He was diagnosed previously with gout although his uric acid this time was normal. Ultrasound Doppler showed deep vein thrombosis at that time. He was started on Xarelto while staying in [place]. He reports taking it regularly till around January 2021 where he has not been taking it regularly.    He underwent repeat vascular ultrasound of the left lower extremity obtained on May 22, 2020 showed findings consistent with chronic deep vein thrombosis involving the left distal popliteal vein, proximal TPT and left peroneal veins.  No acute thrombosis noted and no evidence of thrombosis on the right side.  He continues to have issues with headaches and been evaluated by neurology.  He has also traveled to [place] periodically and received medical care here including his initial diagnosis of deep vein thrombosis. He does not report any pain or discomfort at this time. He denies any family history of thrombosis.  He does not report any headaches, blurry vision, syncope or seizures. Does not report any fevers, chills or sweats.  Does not report any cough, wheezing or hemoptysis.  Does not report any chest pain, palpitation, orthopnea or leg edema.  Does not report any nausea, vomiting or abdominal pain.  Does not report any constipation or diarrhea.  Does not report any skeletal complaints.    Does not report frequency, urgency or hematuria.  Does not report any skin rashes or lesions. Does not report any heat or cold intolerance.  Does not report any  lymphadenopathy or petechiae.  Does not report any anxiety or depression.  Remaining review of systems is negative.    Past Medical History:  Diagnosis Date  . Diabetes mellitus without complication (HCC)   . DVT (deep venous thrombosis) (HCC) 11/2019  :  Past Surgical History:  Procedure Laterality Date  . NO PAST SURGERIES    :   Current Outpatient Medications:  .  gabapentin (NEURONTIN) 300 MG capsule, Take 300 mg by mouth 3 (three) times daily., Disp: , Rfl:  .  imipramine (TOFRANIL) 10 MG tablet, Take one or two about one hour before bedtime, Disp: 60 tablet, Rfl: 3 .  metFORMIN (GLUCOPHAGE) 500 MG tablet, Take 500 mg by mouth daily with breakfast., Disp: , Rfl:  .  Rivaroxaban (XARELTO PO), Take 15 mg by mouth daily., Disp: , Rfl:  .  rizatriptan (MAXALT) 5 MG tablet, One po prn headache.  No more than one a day or 3 a week, Disp: 10 tablet, Rfl: 1:  Allergies  Allergen Reactions  . Ivp Dye [Iodinated Diagnostic Agents]   :  No family history on file.:  Social History   Socioeconomic History  . Marital status: Married    Spouse name: Not on file  . Number of children: Not on file  . Years of education: Not on file  . Highest education level: Not on file  Occupational History  . Not on file  Tobacco Use  . Smoking status: Never Smoker  . Smokeless tobacco: Never Used  Substance and Sexual Activity  .  Alcohol use: Not Currently  . Drug use: Not Currently  . Sexual activity: Yes  Other Topics Concern  . Not on file  Social History Narrative   Right handed   Tea   Social Determinants of Health   Financial Resource Strain: Not on file  Food Insecurity: Not on file  Transportation Needs: Not on file  Physical Activity: Not on file  Stress: Not on file  Social Connections: Not on file  Intimate Partner Violence: Not on file  :  Pertinent items are noted in HPI.  Exam: Blood pressure 134/80, pulse 95, temperature 98 F (36.7 C), temperature source  Temporal, resp. rate 20, height 5\' 6"  (1.676 m), weight 151 lb 11.2 oz (68.8 kg), SpO2 100 %.  ECOG 0  General appearance: alert and cooperative appeared without distress. Head: atraumatic without any abnormalities. Eyes: conjunctivae/corneas clear. PERRL.  Sclera anicteric. Throat: lips, mucosa, and tongue normal; without oral thrush or ulcers. Resp: clear to auscultation bilaterally without rhonchi, wheezes or dullness to percussion. Cardio: regular rate and rhythm, S1, S2 normal, no murmur, click, rub or gallop GI: soft, non-tender; bowel sounds normal; no masses,  no organomegaly Skin: Skin color, texture, turgor normal. No rashes or lesions Lymph nodes: Cervical, supraclavicular, and axillary nodes normal. Neurologic: Grossly normal without any motor, sensory or deep tendon reflexes. Musculoskeletal: No joint deformity or effusion.    EEG adult  Result Date: 05/14/2020 05/16/2020, MD     05/14/2020 10:55 AM Patient Name: Lucas Reyes MRN: [facility] Attending: [id number] Referring Physician/Provider: Dr. Charlsie Quest Date: 05/14/2020 Duration: 23.53 mins Patient history: 45 year old male with episodes of transient confusion.  EEG to evaluate for seizures. Level of alertness: Awake, asleep AEDs during EEG study: Gabapentin Technical aspects: This EEG study was done with scalp electrodes positioned according to the 10-20 International system of electrode placement. Electrical activity was acquired at a sampling rate of 500Hz  and reviewed with a high frequency filter of 70Hz  and a low frequency filter of 1Hz . EEG data were recorded continuously and digitally stored. Description: The posterior dominant rhythm consists of 11 Hz activity of moderate voltage (25-35 uV) seen predominantly in posterior head regions, symmetric and reactive to eye opening and eye closing. Sleep was characterized by vertex waves, sleep spindles (12 to 14 Hz), maximal frontocentral region.  Hyperventilation did not show any EEG change. Physiologic photic driving was seen during photic stimulation.  IMPRESSION: This study is within normal limits. No seizures or epileptiform discharges were seen throughout the recording. Priyanka O Yadav   VAS 54 LOWER EXTREMITY VENOUS (DVT)  Result Date: 05/22/2020  Lower Venous DVT Study Indications: History of DVT in the left lower extremity. Patient was taking 15 mg Xarelto from September 2021 to May 07, 2020. Patient denies any SOB.  Risk Factors: DVT history in 11/2019. Patient had examination done in 05/24/2020. Comparison Study: NA Performing Technologist: October 2021 RVT  Examination Guidelines: A complete evaluation includes B-mode imaging, spectral Doppler, color Doppler, and power Doppler as needed of all accessible portions of each vessel. Bilateral testing is considered an integral part of a complete examination. Limited examinations for reoccurring indications may be performed as noted. The reflux portion of the exam is performed with the patient in reverse Trendelenburg.  +-----+---------------+---------+-----------+----------+--------------+ RIGHTCompressibilityPhasicitySpontaneityPropertiesThrombus Aging +-----+---------------+---------+-----------+----------+--------------+ CFV  Full           Yes      Yes                                 +-----+---------------+---------+-----------+----------+--------------+   +---------+---------------+---------+-----------+---------------+--------------+  LEFT     CompressibilityPhasicitySpontaneityProperties     Thrombus Aging +---------+---------------+---------+-----------+---------------+--------------+ CFV      Full           Yes      Yes                                      +---------+---------------+---------+-----------+---------------+--------------+ SFJ      Full           Yes      Yes                                       +---------+---------------+---------+-----------+---------------+--------------+ FV Prox  Full           Yes      Yes                                      +---------+---------------+---------+-----------+---------------+--------------+ FV Mid   Full                                                             +---------+---------------+---------+-----------+---------------+--------------+ FV DistalFull           Yes      Yes                                      +---------+---------------+---------+-----------+---------------+--------------+ PFV      Full           Yes      Yes                                      +---------+---------------+---------+-----------+---------------+--------------+ POP      Full           Yes      Yes                                      +---------+---------------+---------+-----------+---------------+--------------+ PTV      Full           Yes      Yes                                      +---------+---------------+---------+-----------+---------------+--------------+ PERO     None           No       No         brightly       Chronic                                                    echogenic and  retracted                     +---------+---------------+---------+-----------+---------------+--------------+ Gastroc  Full                                                             +---------+---------------+---------+-----------+---------------+--------------+ GSV      Full           Yes      Yes                                      +---------+---------------+---------+-----------+---------------+--------------+   Left Technical Findings: Chronic non occlusive DVT noted in the distal popliteal vein/proximal TPT. Chronic occlusive thrombus noted in one of the paired peroneal veins.   Summary: RIGHT: - No evidence of common femoral vein obstruction.  LEFT: -  Findings consistent with chronic deep vein thrombosis involving the left distal popliteal vein/proximal TPT, and left peroneal veins. - No cystic structure found in the popliteal fossa.  *See table(s) above for measurements and observations. Electronically signed by Lance Muss MD on 05/22/2020 at 7:15:50 PM.    Final     Assessment and Plan:   45 year old man with:  1.  Deep vein thrombosis involving the left lower extremity diagnosed in September 2021. No clear provoking factors or family history noted. He was started on Xarelto although therapy has been interrupted after 3 months.  Repeat ultrasound in February 2022 showed chronic deep vein thrombosis without any new clots.  The natural course of this condition were discussed at this time and management options were reviewed.  The role for evaluation for inherited and acquired thrombophilia were discussed.  Hypercoagulable panel indication were discussed at this time.    Duration of anticoagulation between 3 to 6 months versus indefinite were discussed.   After discussion today, he is agreeable to proceed with a hypercoagulable panel screen and I recommended taking Xarelto for 3 more months to complete a total of around 6 months of anticoagulation. Continuation of anticoagulation beyond that would be determined after his hypercoagulable panel. He is agreeable to proceed with this plan.   2.  Follow-up: In 3 months for repeat follow-up.  45  minutes were dedicated to this visit. The time was spent on reviewing  imaging studies, discussing treatment options, discussing differential diagnosis and answering questions regarding future plan.     A copy of this consult has been forwarded to the requesting physician.

## 2020-05-29 ENCOUNTER — Encounter: Payer: Self-pay | Admitting: Oncology

## 2020-05-29 LAB — BETA-2-GLYCOPROTEIN I ABS, IGG/M/A
Beta-2 Glyco I IgG: <9 GPI IgG units (ref 0–20)
Beta-2-Glycoprotein I IgA: <9 GPI IgA units (ref 0–25)
Beta-2-Glycoprotein I IgM: <9 GPI IgM units (ref 0–32)

## 2020-05-29 LAB — HOMOCYSTEINE: Homocysteine: 6.7 umol/L (ref 0.0–14.5)

## 2020-05-29 LAB — PROTEIN S ACTIVITY: Protein S Activity: 59 % — ABNORMAL LOW (ref 63–140)

## 2020-05-29 LAB — PROTEIN C, TOTAL: Protein C, Total: 113 % (ref 60–150)

## 2020-05-29 LAB — CARDIOLIPIN ANTIBODIES, IGG, IGM, IGA
Anticardiolipin IgA: <9 APL U/mL (ref 0–11)
Anticardiolipin IgG: <9 GPL U/mL (ref 0–14)
Anticardiolipin IgM: <9 MPL U/mL (ref 0–12)

## 2020-05-29 LAB — LUPUS ANTICOAGULANT PANEL
DRVVT: 35.4 s (ref 0.0–47.0)
PTT Lupus Anticoagulant: 25.9 s (ref 0.0–51.9)

## 2020-05-29 LAB — PROTEIN C ACTIVITY: Protein C Activity: 148 % (ref 73–180)

## 2020-05-29 LAB — PROTEIN S, TOTAL: Protein S Ag, Total: 83 % (ref 60–150)

## 2020-06-01 ENCOUNTER — Other Ambulatory Visit: Payer: Self-pay | Admitting: Neurology

## 2020-06-01 ENCOUNTER — Other Ambulatory Visit: Payer: Self-pay | Admitting: Oncology

## 2020-06-01 MED ORDER — RIVAROXABAN 20 MG PO TABS: 20.0000 mg | ORAL_TABLET | Freq: Every day | ORAL | 2 refills | Status: AC

## 2020-06-01 MED ORDER — ZONISAMIDE 50 MG PO CAPS: ORAL_CAPSULE | ORAL | 2 refills | Status: AC

## 2020-06-02 LAB — FACTOR 5 LEIDEN

## 2020-06-03 LAB — PROTHROMBIN GENE MUTATION

## 2020-06-19 DIAGNOSIS — E7800 Pure hypercholesterolemia, unspecified: Secondary | ICD-10-CM | POA: Diagnosis not present

## 2020-06-19 DIAGNOSIS — E119 Type 2 diabetes mellitus without complications: Secondary | ICD-10-CM | POA: Diagnosis not present

## 2020-06-19 DIAGNOSIS — E291 Testicular hypofunction: Secondary | ICD-10-CM | POA: Diagnosis not present

## 2020-06-19 DIAGNOSIS — R7871 Abnormal lead level in blood: Secondary | ICD-10-CM | POA: Diagnosis not present

## 2020-06-22 ENCOUNTER — Ambulatory Visit: Payer: BC Managed Care – PPO | Admitting: Neurology

## 2020-06-23 ENCOUNTER — Telehealth: Payer: Self-pay | Admitting: *Deleted

## 2020-06-23 ENCOUNTER — Telehealth: Payer: Self-pay | Admitting: Oncology

## 2020-06-23 NOTE — Telephone Encounter (Signed)
-----   Message from Benjiman Core, MD sent at 06/23/2020 12:24 PM EDT ----- Regarding: RE: Patient request for follow-up Please let him know that his labs are all normal. I will arrange for a phone visit soon to discuss futher. He will be contacted by our schedulers. Thanks ----- Message ----- From: Arville Care, RN Sent: 06/23/2020  12:21 PM EDT To: Benjiman Core, MD Subject: Patient request for follow-up                  Patient called office requesting follow-up regarding his test results from 05/28/20.  Per your last office visit, results would be discussed at follow-up visit in June.  Patient is requesting for follow-up regarding his test results sooner if possible. Patient is requesting for you to call him to go through test results.  Please advise.

## 2020-06-23 NOTE — Telephone Encounter (Signed)
Scheduled appt per 3/29 sch msg. Pt aware.  

## 2020-06-23 NOTE — Telephone Encounter (Signed)
Please see message below, informed patient of Dr. Alver Fisher response.  Patient verbalized understanding & states he has already been in contact with scheduling.

## 2020-06-25 ENCOUNTER — Inpatient Hospital Stay (HOSPITAL_BASED_OUTPATIENT_CLINIC_OR_DEPARTMENT_OTHER): Payer: BC Managed Care – PPO | Admitting: Oncology

## 2020-06-25 ENCOUNTER — Telehealth: Payer: Self-pay | Admitting: *Deleted

## 2020-06-25 DIAGNOSIS — Z86718 Personal history of other venous thrombosis and embolism: Secondary | ICD-10-CM

## 2020-06-25 NOTE — Telephone Encounter (Signed)
Per Dr. Clelia Croft, phone call has been completed.

## 2020-06-25 NOTE — Progress Notes (Signed)
Hematology and Oncology Follow Up for Telemedicine Visits  Lucas Reyes [id number] November 01, 1975 44 y.o. 06/25/2020 11:23 AM System, Provider Not Lucas Pence, MD   I connected with Lucas Reyes on 06/25/20 at  1:00 PM EDT by telephone visit and verified that I am speaking with the correct person using two identifiers.   I discussed the limitations, risks, security and privacy concerns of performing an evaluation and management service by telemedicine and the availability of in-person appointments. I also discussed with the patient that there may be a patient responsible charge related to this service. The patient expressed understanding and agreed to proceed.  Other persons participating in the visit and their role in the encounter:  None  Patient's location:  Home Provider's location:  office    Principle Diagnosis: 45 year old man with a deep vein thrombosis of the left lower extremity diagnosed in September 2021.  It is unclear he had any provoking symptoms could be related to long travel to [place].     Current therapy: Xarelto started in September 2021 and was interrupted in January and subsequently restarted in January 2022.  Interim History:  Lucas Reyes reports no major complaints at this time.  He continues to tolerate Xarelto 20 mg daily without any complaints.  He denies any worsening edema or bleeding complications.     Medications: I have reviewed the patient's current medications.  Current Outpatient Medications  Medication Sig Dispense Refill  . gabapentin (NEURONTIN) 300 MG capsule Take 300 mg by mouth 3 (three) times daily.    Marland Kitchen imipramine (TOFRANIL) 10 MG tablet Take one or two about one hour before bedtime (Patient not taking: Reported on 05/28/2020) 60 tablet 3  . metFORMIN (GLUCOPHAGE) 500 MG tablet Take 500 mg by mouth daily with breakfast.    . rivaroxaban (XARELTO) 20 MG TABS tablet Take 1 tablet (20 mg total) by mouth daily with supper. 30 tablet 2  .  rizatriptan (MAXALT) 5 MG tablet One po prn headache.  No more than one a day or 3 a week 10 tablet 1  . zonisamide (ZONEGRAN) 50 MG capsule Take 1 - 2 po nightly 60 capsule 2   No current facility-administered medications for this visit.     Allergies:  Allergies  Allergen Reactions  . Ivp Dye [Iodinated Diagnostic Agents]        Lab Results: Lab Results  Component Value Date   WBC 4.7 03/03/2020   HGB 16.0 03/03/2020   HCT 47.0 03/03/2020   MCV 85.6 03/03/2020   PLT 278 03/03/2020     Chemistry      Component Value Date/Time   NA 139 03/03/2020 2216   K 3.8 03/03/2020 2216   CL 101 03/03/2020 2216   CO2 26 03/03/2020 2144   BUN 19 03/03/2020 2216   CREATININE 1.00 03/03/2020 2216      Component Value Date/Time   CALCIUM 9.5 03/03/2020 2144   ALKPHOS 70 03/03/2020 2144   AST 14 (L) 03/03/2020 2144   ALT 16 03/03/2020 2144   BILITOT 1.3 (H) 03/03/2020 2144        Impression and Plan:  45 year old man with:  1.    Venous thromboembolism presented with Deep vein thrombosis involving the left lower extremity diagnosed in September 2021.  This is in the setting of a possible travel to [place] at that time.  He is currently on full dose anticoagulation with Xarelto.  Laboratory data obtained on May 28, 2020 was personally reviewed and showed no inherited  or acquired thrombophilia at this time.  The duration of anticoagulation was discussed at this time including completing 6 months total versus lifetime anticoagulation.  He has been erratic and taking Xarelto and also taking it at lower doses of 15 mg versus 20 recommended daily dose.  I recommended continuing Xarelto at this time to complete 24months from March till June 2022 and discontinue after that.   He is agreeable at this time.  2.  Follow-up:  Is scheduled for a follow-up in June 2022.    I discussed the assessment and treatment plan with the patient. The patient was provided an opportunity to ask  questions and all were answered. The patient agreed with the plan and demonstrated an understanding of the instructions.   The patient was advised to call back or seek an in-person evaluation if the symptoms worsen or if the condition fails to improve as anticipated.  I provided 20 minutes of non face-to-face telephone visit time during this encounter.  The time was dedicated to reviewing laboratory data, disease status update and outlining future plan of care. Lucas Hose, MD 06/25/2020 11:23 AM

## 2020-06-29 NOTE — Telephone Encounter (Signed)
Pt was seen in clinic by Dr. Shari Prows on 05/19/20, where further plan was discussed.  Will close this encounter.

## 2020-06-29 NOTE — Telephone Encounter (Signed)
May 26, 2020  Meriam Sprague, MD to Lucas Reyes   HP   12:26 PM Hello Lucas Reyes,  I think our wonderful nursing staff called you yesterday about your doppler results. They show that you continue to have a clot in the left leg. The clot looks old (so nothing new has developed) but this means we need to keep you on the xarelto for at least 3 more months (maybe 6 more months) without missing doses. We will also refer you to a hematologist who specializes in clotting disorders to see if any other labs tests need to be done to help figure out why you developed the clot in the first place.   Last read by Lucas Reyes at 10:37 AM on 05/27/2020.     Note   Pt is scheduled for new pt appt with Hematology on 05/28/20 at 1100, with Dr. Clelia Croft. Pt made aware of appt date and time by [facility] scheduling.      May 26, 2020  Lucas Sicks, RN     4:20 PM Note   Spoke with pt and explained what a Hematologist is.  Pt agreeable to go ahead and have referral placed.  Advised I will put that in and that office will be in contact to get him scheduled.  Pt appreciative for call.

## 2020-07-01 DIAGNOSIS — Z0271 Encounter for disability determination: Secondary | ICD-10-CM

## 2020-07-03 DIAGNOSIS — R351 Nocturia: Secondary | ICD-10-CM | POA: Diagnosis not present

## 2020-07-03 DIAGNOSIS — E7800 Pure hypercholesterolemia, unspecified: Secondary | ICD-10-CM | POA: Diagnosis not present

## 2020-07-03 DIAGNOSIS — E291 Testicular hypofunction: Secondary | ICD-10-CM | POA: Diagnosis not present

## 2020-07-03 DIAGNOSIS — M7981 Nontraumatic hematoma of soft tissue: Secondary | ICD-10-CM | POA: Diagnosis not present

## 2020-07-16 DIAGNOSIS — E119 Type 2 diabetes mellitus without complications: Secondary | ICD-10-CM | POA: Diagnosis not present

## 2020-07-16 DIAGNOSIS — E291 Testicular hypofunction: Secondary | ICD-10-CM | POA: Diagnosis not present

## 2020-07-16 DIAGNOSIS — E7800 Pure hypercholesterolemia, unspecified: Secondary | ICD-10-CM | POA: Diagnosis not present

## 2020-07-16 DIAGNOSIS — R351 Nocturia: Secondary | ICD-10-CM | POA: Diagnosis not present

## 2020-07-17 DIAGNOSIS — R825 Elevated urine levels of drugs, medicaments and biological substances: Secondary | ICD-10-CM | POA: Diagnosis not present

## 2020-07-30 ENCOUNTER — Ambulatory Visit: Payer: Self-pay | Admitting: Neurology

## 2020-08-03 DIAGNOSIS — R519 Headache, unspecified: Secondary | ICD-10-CM

## 2020-08-13 NOTE — Telephone Encounter (Signed)
I spoke to Lucas Reyes..  I once again went over the results of the MRI which shows white matter foci most consistent with chronic microvascular ischemic changes.  They could also be migraine related.  They do not have an appearance that would be typical for demyelination.  I also reviewed the MRI of the cervical spine.  She does have multilevel degenerative changes with disc protrusions and uncovertebral spurring.  There is mild spinal stenosis at C3-C4 through C6-C7 and moderate foraminal narrowing to the right at C3-C4.  There is no definite nerve root compression.  I discussed with him that there were no findings that would be worrisome enough to send him to a surgeon at this time.  Despite trying several medical regimens, Lucas Reyes continues to experience headaches.  These are not typical migraine.  I have tried some medications including gabapentin, imipramine and Zonegran and Lucas Reyes reports that these have not helped.    Lucas Reyes would like to have a referral to a headache specialist.

## 2020-08-14 DIAGNOSIS — Z113 Encounter for screening for infections with a predominantly sexual mode of transmission: Secondary | ICD-10-CM | POA: Diagnosis not present

## 2020-08-14 DIAGNOSIS — R519 Headache, unspecified: Secondary | ICD-10-CM | POA: Diagnosis not present

## 2020-08-26 ENCOUNTER — Telehealth: Payer: Self-pay | Admitting: Oncology

## 2020-08-26 NOTE — Telephone Encounter (Signed)
R/s appt as a phone visit per 6/1 sch msg. Pt aware.

## 2020-08-27 ENCOUNTER — Inpatient Hospital Stay: Payer: BC Managed Care – PPO | Attending: Oncology | Admitting: Oncology

## 2020-08-27 ENCOUNTER — Encounter: Payer: Self-pay | Admitting: Oncology

## 2020-08-27 DIAGNOSIS — Z86718 Personal history of other venous thrombosis and embolism: Secondary | ICD-10-CM

## 2020-08-27 NOTE — Progress Notes (Signed)
Hematology and Oncology Follow Up for Telemedicine Visits  Lucas Reyes [id number] 1976/03/24 44 y.o. 08/27/2020 11:55 AM System, Provider Not Isla Pence, MD   I connected with Lucas Reyes on 08/27/20 at  1:30 PM EDT by telephone visit and verified that I am speaking with the correct person using two identifiers.   I discussed the limitations, risks, security and privacy concerns of performing an evaluation and management service by telemedicine and the availability of in-person appointments. I also discussed with the patient that there may be a patient responsible charge related to this service. The patient expressed understanding and agreed to proceed.  Other persons participating in the visit and their role in the encounter:  None  Patient's location:  Home Provider's location:  office    Principle Diagnosis: 45 year old man with venous thromboembolism including a deep vein thrombosis of the left lower extremity diagnosed in September 2021.  This appears to be unprovoked although he was traveling to [place] around that time.    Current therapy: Xarelto started in September 2021 and was interrupted in January and subsequently restarted in January 2022.  Interim History:  Lucas Reyes denies any new issues at this time.  He continues to be on Xarelto without any new complications.  He denies any bleeding, bruising or any complaints.  He does report some occasional fatigue and tiredness and swelling in his left ankle which is chronic in nature.  He has relocated to [place] in the interim.     Medications: Updated on review. Current Outpatient Medications  Medication Sig Dispense Refill  . gabapentin (NEURONTIN) 300 MG capsule Take 300 mg by mouth 3 (three) times daily.    Marland Kitchen imipramine (TOFRANIL) 10 MG tablet Take one or two about one hour before bedtime (Patient not taking: Reported on 05/28/2020) 60 tablet 3  . metFORMIN (GLUCOPHAGE) 500 MG tablet Take 500 mg by mouth daily with  breakfast.    . rivaroxaban (XARELTO) 20 MG TABS tablet Take 1 tablet (20 mg total) by mouth daily with supper. 30 tablet 2  . rizatriptan (MAXALT) 5 MG tablet One po prn headache.  No more than one a day or 3 a week 10 tablet 1  . zonisamide (ZONEGRAN) 50 MG capsule Take 1 - 2 po nightly 60 capsule 2   No current facility-administered medications for this visit.     Allergies:  Allergies  Allergen Reactions  . Ivp Dye [Iodinated Diagnostic Agents]        Lab Results: Lab Results  Component Value Date   WBC 4.7 03/03/2020   HGB 16.0 03/03/2020   HCT 47.0 03/03/2020   MCV 85.6 03/03/2020   PLT 278 03/03/2020     Chemistry      Component Value Date/Time   NA 139 03/03/2020 2216   K 3.8 03/03/2020 2216   CL 101 03/03/2020 2216   CO2 26 03/03/2020 2144   BUN 19 03/03/2020 2216   CREATININE 1.00 03/03/2020 2216      Component Value Date/Time   CALCIUM 9.5 03/03/2020 2144   ALKPHOS 70 03/03/2020 2144   AST 14 (L) 03/03/2020 2144   ALT 16 03/03/2020 2144   BILITOT 1.3 (H) 03/03/2020 2144        Impression and Plan:  45 year old man with:  1.   Deep vein thrombosis involving the left lower extremity diagnosed in September 2021.  This appears to be unprovoked although a trip to [place] might have resulted in this episode.  He is currently  on Xarelto.  Treatment options moving forward were discussed at this time.  Risks and benefits of continuing Xarelto long-term versus discontinuation were reviewed.  Given the fact that his first episode could presumably be provoked by his long travel it is reasonable to consider discontinuation.  He understands if he developed any recurrent thrombosis lifetime anticoagulation may be warranted.Alternatively, long-term anticoagulation carries a risk of bleeding given his young age and life expectancy.  After discussion today, he is agreeable to finish the current prescription which will conclude at the end of this month and to stop  Xarelto after that.  I recommended a repeat ultrasound after that to ensure no new clots have developed.  I urged him also to establish care with a primary care provider in [place] where he currently resides.  2.  Follow-up:  happy to see him and discuss this in the future as needed.    I discussed the assessment and treatment plan with the patient. The patient was provided an opportunity to ask questions and all were answered. The patient agreed with the plan and demonstrated an understanding of the instructions.   The patient was advised to call back or seek an in-person evaluation if the symptoms worsen or if the condition fails to improve as anticipated.  I provided 20 minutes of non face-to-face telephone visit time during this encounter.  The time was spent on updating his disease status, treatment options and complications related to therapy. [name], MD 08/27/2020 11:55 AM

## 2020-08-28 ENCOUNTER — Telehealth: Payer: Self-pay | Admitting: Cardiology

## 2020-08-28 NOTE — Telephone Encounter (Signed)
Pt is callingto update the doctor ,PT states the doctor told him to give her a call to provide a update on how things are going.

## 2020-08-28 NOTE — Telephone Encounter (Signed)
Patient advised to call in June stating that Dr. Shari Prows is following him with his Xarelto. He reports taking the medication as prescribed with no signs of bleeding. Stating that he lives in [place] and Dr. Shari Prows asked him to call and she would call him back so they can set up a plan going forward so he can know when to come back to .   Will forward to Dr. Shari Prows.

## 2020-08-28 NOTE — Telephone Encounter (Signed)
It looks like Heme has been following his xarelto and managing the dosage and duration. He does not need to follow-up with me for 6 months. If he has moved, he can also establish care with a new provider there so he does not have to travel back and forth.

## 2020-08-31 DIAGNOSIS — E119 Type 2 diabetes mellitus without complications: Secondary | ICD-10-CM | POA: Diagnosis not present

## 2020-08-31 DIAGNOSIS — J309 Allergic rhinitis, unspecified: Secondary | ICD-10-CM | POA: Diagnosis not present

## 2020-08-31 DIAGNOSIS — I739 Peripheral vascular disease, unspecified: Secondary | ICD-10-CM | POA: Diagnosis not present

## 2020-08-31 DIAGNOSIS — Z0001 Encounter for general adult medical examination with abnormal findings: Secondary | ICD-10-CM | POA: Diagnosis not present

## 2020-08-31 NOTE — Telephone Encounter (Signed)
Pt recalled to see Dr. Shari Prows in 6 months.  Scheduling will call him closer to that time to arrange his 6 month follow-up appt.

## 2020-09-01 DIAGNOSIS — I10 Essential (primary) hypertension: Secondary | ICD-10-CM | POA: Diagnosis not present

## 2020-09-01 DIAGNOSIS — J309 Allergic rhinitis, unspecified: Secondary | ICD-10-CM | POA: Diagnosis not present

## 2020-09-01 DIAGNOSIS — E119 Type 2 diabetes mellitus without complications: Secondary | ICD-10-CM | POA: Diagnosis not present

## 2020-09-01 DIAGNOSIS — G4700 Insomnia, unspecified: Secondary | ICD-10-CM | POA: Diagnosis not present

## 2020-09-09 DIAGNOSIS — G4700 Insomnia, unspecified: Secondary | ICD-10-CM | POA: Diagnosis not present

## 2020-09-09 DIAGNOSIS — I10 Essential (primary) hypertension: Secondary | ICD-10-CM | POA: Diagnosis not present

## 2020-09-09 DIAGNOSIS — E119 Type 2 diabetes mellitus without complications: Secondary | ICD-10-CM | POA: Diagnosis not present

## 2020-09-09 DIAGNOSIS — J309 Allergic rhinitis, unspecified: Secondary | ICD-10-CM | POA: Diagnosis not present

## 2020-09-10 ENCOUNTER — Telehealth: Payer: Self-pay | Admitting: *Deleted

## 2020-09-10 ENCOUNTER — Other Ambulatory Visit: Payer: Self-pay | Admitting: Oncology

## 2020-09-10 NOTE — Telephone Encounter (Signed)
Patient called. His last appt with Dr. Clelia Croft was by phone on 6/2.Dr. Clelia Croft advised him to stop Xarelto after completing the current prescription. Patient has now moved to [facility] and saw a medical provider there in past few days. MD reviewed medical records/lab results patient took to [facility] with him.  The MD in [place] asked Mr. Climer to find out why Dr. Clelia Croft told him to stop Xarelto when his protein S activity was 59 and that indicates increased risk for repeat blood clot. Mr. Mierzwa wants to know if he should still stop.  Mr. Garcilazo would like to speak with Dr. Clelia Croft to find out the answer to this question. Call routed to Dr. Clelia Croft

## 2020-09-15 ENCOUNTER — Telehealth: Payer: Self-pay

## 2020-09-15 NOTE — Telephone Encounter (Signed)
Pt call and LVM asking for a call back as he has questions, but did not specify. This LPN attempted to call pt. No VM box set up.

## 2020-09-23 ENCOUNTER — Telehealth: Payer: Self-pay | Admitting: *Deleted

## 2020-09-23 NOTE — Telephone Encounter (Signed)
Mr Sittner is calling to ask again about stopping Xarelto. He doesn't understand why he is to stop. Has seen a physician while he is in [place].  I reviewed the most recent office note with him and read to him what Dr Clelia Croft told him about why he should stop Xarelto. Also reviewed phone note from 09/10/20 reinforcing stoppage of Xarelto.  I told him I will sent this information to his physician in [place].    RN faxed office note from 08/31/20 and phone note from 09/10/20 to Dr Janelle Floor in [place].

## 2020-09-30 DIAGNOSIS — E119 Type 2 diabetes mellitus without complications: Secondary | ICD-10-CM | POA: Diagnosis not present

## 2020-09-30 DIAGNOSIS — I739 Peripheral vascular disease, unspecified: Secondary | ICD-10-CM | POA: Diagnosis not present

## 2020-09-30 DIAGNOSIS — G4700 Insomnia, unspecified: Secondary | ICD-10-CM | POA: Diagnosis not present

## 2020-09-30 DIAGNOSIS — I10 Essential (primary) hypertension: Secondary | ICD-10-CM | POA: Diagnosis not present

## 2020-10-01 ENCOUNTER — Telehealth: Payer: Self-pay

## 2020-10-01 DIAGNOSIS — R0683 Snoring: Secondary | ICD-10-CM | POA: Diagnosis not present

## 2020-10-01 DIAGNOSIS — R519 Headache, unspecified: Secondary | ICD-10-CM | POA: Diagnosis not present

## 2020-10-01 DIAGNOSIS — G4730 Sleep apnea, unspecified: Secondary | ICD-10-CM | POA: Diagnosis not present

## 2020-10-01 NOTE — Telephone Encounter (Signed)
Spoke with patient regarding medical records being sent to his doctor's office in [place]. Patient stated that at his most recent visit with his doctor, they had not received any of the documentation from our office. Able to speak to representative from his doctor's office and was able to obtain and verify the correct fax number [phone]).  Patient verbalized his consent for [place] to send his documents at this time.  Fax sent to Dr. Azell Der office with last office note and labs. Received confirmation that transmission had been successfully received.

## 2020-10-09 DIAGNOSIS — G4733 Obstructive sleep apnea (adult) (pediatric): Secondary | ICD-10-CM | POA: Diagnosis not present

## 2020-10-10 DIAGNOSIS — G4733 Obstructive sleep apnea (adult) (pediatric): Secondary | ICD-10-CM | POA: Diagnosis not present

## 2020-10-11 DIAGNOSIS — G4733 Obstructive sleep apnea (adult) (pediatric): Secondary | ICD-10-CM | POA: Diagnosis not present

## 2020-10-12 DIAGNOSIS — G4730 Sleep apnea, unspecified: Secondary | ICD-10-CM | POA: Diagnosis not present

## 2020-10-12 DIAGNOSIS — R519 Headache, unspecified: Secondary | ICD-10-CM | POA: Diagnosis not present

## 2020-10-16 DIAGNOSIS — E7800 Pure hypercholesterolemia, unspecified: Secondary | ICD-10-CM | POA: Diagnosis not present

## 2020-10-16 DIAGNOSIS — I10 Essential (primary) hypertension: Secondary | ICD-10-CM | POA: Diagnosis not present

## 2020-10-16 DIAGNOSIS — I739 Peripheral vascular disease, unspecified: Secondary | ICD-10-CM | POA: Diagnosis not present

## 2020-10-16 DIAGNOSIS — E1165 Type 2 diabetes mellitus with hyperglycemia: Secondary | ICD-10-CM | POA: Diagnosis not present

## 2020-10-20 DIAGNOSIS — M436 Torticollis: Secondary | ICD-10-CM | POA: Diagnosis not present

## 2020-10-20 DIAGNOSIS — M542 Cervicalgia: Secondary | ICD-10-CM | POA: Diagnosis not present

## 2020-10-27 DIAGNOSIS — E7800 Pure hypercholesterolemia, unspecified: Secondary | ICD-10-CM | POA: Diagnosis not present

## 2020-10-27 DIAGNOSIS — J449 Chronic obstructive pulmonary disease, unspecified: Secondary | ICD-10-CM | POA: Diagnosis not present

## 2020-10-27 DIAGNOSIS — J309 Allergic rhinitis, unspecified: Secondary | ICD-10-CM | POA: Diagnosis not present

## 2020-10-27 DIAGNOSIS — R2241 Localized swelling, mass and lump, right lower limb: Secondary | ICD-10-CM | POA: Diagnosis not present

## 2020-10-27 DIAGNOSIS — G4730 Sleep apnea, unspecified: Secondary | ICD-10-CM | POA: Diagnosis not present

## 2020-11-02 ENCOUNTER — Encounter: Payer: Self-pay | Admitting: Emergency Medicine

## 2020-11-02 ENCOUNTER — Emergency Department: Payer: BC Managed Care – PPO

## 2020-11-02 ENCOUNTER — Emergency Department
Admission: EM | Admit: 2020-11-02 | Discharge: 2020-11-02 | Disposition: A | Discharge status: Home / Self Care | Payer: BC Managed Care – PPO | Attending: Emergency Medicine | Admitting: Emergency Medicine

## 2020-11-02 ENCOUNTER — Other Ambulatory Visit: Payer: Self-pay

## 2020-11-02 DIAGNOSIS — E119 Type 2 diabetes mellitus without complications: Secondary | ICD-10-CM | POA: Diagnosis not present

## 2020-11-02 DIAGNOSIS — Z7901 Long term (current) use of anticoagulants: Secondary | ICD-10-CM | POA: Diagnosis not present

## 2020-11-02 DIAGNOSIS — M19011 Primary osteoarthritis, right shoulder: Secondary | ICD-10-CM | POA: Diagnosis not present

## 2020-11-02 DIAGNOSIS — Z7984 Long term (current) use of oral hypoglycemic drugs: Secondary | ICD-10-CM | POA: Diagnosis not present

## 2020-11-02 DIAGNOSIS — M25511 Pain in right shoulder: Secondary | ICD-10-CM | POA: Diagnosis not present

## 2020-11-02 DIAGNOSIS — M5412 Radiculopathy, cervical region: Secondary | ICD-10-CM | POA: Diagnosis not present

## 2020-11-02 DIAGNOSIS — M542 Cervicalgia: Secondary | ICD-10-CM | POA: Diagnosis not present

## 2020-11-02 MED ORDER — METHYLPREDNISOLONE 4 MG PO TBPK: ORAL_TABLET | ORAL | 0 refills | Status: DC

## 2020-11-02 NOTE — ED Triage Notes (Signed)
Pt reports that he developed right sided chest pain that shoots into his shoulder a few weeks ago he has been using bengay but it is not working. He reports that it hurts worse when he moves, when he coughs it hurts also.

## 2020-11-02 NOTE — ED Provider Notes (Signed)
[facility] Emergency Department Provider Note  ____________________________________________   Event Date/Time   First MD Initiated Contact with Patient 11/02/20 1238     (approximate)  I have reviewed the triage vital signs and the nursing notes.   HISTORY  Chief Complaint Arm Pain    HPI Lucas Reyes is a 45 y.o. male presents emergency department complaining of right shoulder and right-sided neck pain with radiculopathy to the right hand.  Patient states that originally started as he thought he had slept wrong.  It is progressed to the him being unable to move the shoulder without excruciating pain.  Pain will radiate into the right side of his upper chest.  Patient was seen at urgent care and was given a muscle relaxer and anti-inflammatory.  This is not helped.  Patient has a history of DVT but takes his blood thinner regularly.  He has had no cough or pain with coughing.  Past Medical History:  Diagnosis Date   Diabetes mellitus without complication (HCC)    DVT (deep venous thrombosis) (HCC) 11/2019    Patient Active Problem List   Diagnosis Date Noted   Atrial septal defect, secundum 03/18/2020   Pressure in head 03/09/2020   White matter abnormality on MRI of brain 03/09/2020   History of DVT in adulthood 03/09/2020   Vision disturbance 03/09/2020    Past Surgical History:  Procedure Laterality Date   NO PAST SURGERIES      Prior to Admission medications   Medication Sig Start Date End Date Taking? Authorizing Provider  methylPREDNISolone (MEDROL DOSEPAK) 4 MG TBPK tablet Take 6 pills on day one then decrease by 1 pill each day 11/02/20  Yes Jessah Danser, Roselyn Bering, PA-C  gabapentin (NEURONTIN) 300 MG capsule Take 300 mg by mouth 3 (three) times daily. 05/01/20   [provider]  metFORMIN (GLUCOPHAGE) 500 MG tablet Take 500 mg by mouth daily with breakfast.    [provider]  rivaroxaban (XARELTO) 20 MG TABS tablet Take 1 tablet  (20 mg total) by mouth daily with supper. 06/01/20   Benjiman Core, MD  rizatriptan (MAXALT) 5 MG tablet One po prn headache.  No more than one a day or 3 a week 04/23/20   Sater, Pearletha Furl, MD  zonisamide (ZONEGRAN) 50 MG capsule Take 1 - 2 po nightly 06/01/20   Sater, Pearletha Furl, MD    Allergies Ivp dye [iodinated diagnostic agents]  History reviewed. No pertinent family history.  Social History Social History   Tobacco Use   Smoking status: Never   Smokeless tobacco: Never  Substance Use Topics   Alcohol use: Not Currently   Drug use: Not Currently    Review of Systems  Constitutional: No fever/chills Eyes: No visual changes. ENT: No sore throat. Respiratory: Denies cough Cardiovascular: Denies chest pain Gastrointestinal: Denies abdominal pain Genitourinary: Negative for dysuria. Musculoskeletal: Negative for back pain.  Positive for right shoulder.  Covid radiation to the right arm Skin: Negative for rash. Psychiatric: no mood changes,     ____________________________________________   PHYSICAL EXAM:  VITAL SIGNS: ED Triage Vitals  Enc Vitals Group     BP 11/02/20 1145 124/73     Pulse Rate 11/02/20 1145 84     Resp 11/02/20 1145 20     Temp 11/02/20 1145 98.1 F (36.7 C)     Temp Source 11/02/20 1145 Oral     SpO2 11/02/20 1145 100 %     Weight 11/02/20 1146 151  lb 10.8 oz (68.8 kg)     Height 11/02/20 1146 5\' 6"  (1.676 m)     Head Circumference --      Peak Flow --      Pain Score 11/02/20 1146 10     Pain Loc --      Pain Edu? --      Excl. in GC? --     Constitutional: Alert and oriented. Well appearing and in no acute distress. Eyes: Conjunctivae are normal.  Head: Atraumatic. Nose: No congestion/rhinnorhea. Mouth/Throat: Mucous membranes are moist.   Neck:  supple no lymphadenopathy noted Cardiovascular: Normal rate, regular rhythm.  Respiratory: Normal respiratory effort.  No retractions, lungs c t a  GU: deferred Musculoskeletal: FROM all  extremities, warm and well perfused, pain is reproduced with movement of the right shoulder, right shoulder has an area of softness along the trapezius and supraspinatus muscle, patient cannot reach overhead completely, pain reproduced with Hochstein, grips are somewhat decreased on the right side versus the left.  Still intact. Neurologic:  Normal speech and language.  Skin:  Skin is warm, dry and intact. No rash noted. Psychiatric: Mood and affect are normal. Speech and behavior are normal.  ____________________________________________   LABS (all labs ordered are listed, but only abnormal results are displayed)  Labs Reviewed - No data to display ____________________________________________   ____________________________________________  RADIOLOGY  X-ray of the C-spine and right shoulder  ____________________________________________   PROCEDURES  Procedure(s) performed: sling  Procedures    ____________________________________________   INITIAL IMPRESSION / ASSESSMENT AND PLAN / ED COURSE  Pertinent labs & imaging results that were available during my care of the patient were reviewed by me and considered in my medical decision making (see chart for details).   The patient is a 45 year old male presents emergency department for right shoulder and neck pain with radicular pain to the right arm  X-ray of the right shoulder and C-spine  Xray reviewed by me and confirmed by radiology to be negative for any acute abnormality  Explained the findings to the patient, he was placed on medrol dose pack, given sling F/u with orthopedics if not improving in 1 week Explained to him that we do not do nonurgent mri's in the ER, he can follow up with ortho for evaluation Discharged in stable condition    Lucas Reyes was evaluated in Emergency Department on 11/02/2020 for the symptoms described in the history of present illness. He was evaluated in the context of the global COVID-19  pandemic, which necessitated consideration that the patient might be at risk for infection with the SARS-CoV-2 virus that causes COVID-19. Institutional protocols and algorithms that pertain to the evaluation of patients at risk for COVID-19 are in a state of rapid change based on information released by regulatory bodies including the CDC and federal and state organizations. These policies and algorithms were followed during the patient's care in the ED.    As part of my medical decision making, I reviewed the following data within the electronic MEDICAL RECORD NUMBER Nursing notes reviewed and incorporated, Old chart reviewed, Radiograph reviewed , Notes from prior ED visits, and [place] Controlled Substance Database  ____________________________________________   FINAL CLINICAL IMPRESSION(S) / ED DIAGNOSES  Final diagnoses:  Cervical radiculopathy      NEW MEDICATIONS STARTED DURING THIS VISIT:  New Prescriptions   METHYLPREDNISOLONE (MEDROL DOSEPAK) 4 MG TBPK TABLET    Take 6 pills on day one then decrease by 1 pill each day  Note:  This document was prepared using Dragon voice recognition software and may include unintentional dictation errors.    Faythe Ghee, PA-C 11/02/20 1529    Chesley Noon, MD 11/03/20 [phone]

## 2020-11-02 NOTE — ED Notes (Signed)
See triage note Presents with right sided chest pain which moves into shoulder  Denies any specific injury

## 2020-11-02 NOTE — ED Triage Notes (Signed)
Pt reports that he did go to Urgent care and they put him on a muscle relaxer that did not help and he states they told him to come here for a CT

## 2020-11-02 NOTE — Discharge Instructions (Addendum)
Follow up with orthopedics if not improving in 1 week Apply ice to right

## 2020-11-03 ENCOUNTER — Emergency Department (HOSPITAL_COMMUNITY)
Admission: EM | Admit: 2020-11-03 | Discharge: 2020-11-04 | Disposition: A | Discharge status: Home / Self Care | Payer: BC Managed Care – PPO | Attending: Emergency Medicine | Admitting: Emergency Medicine

## 2020-11-03 ENCOUNTER — Encounter (HOSPITAL_COMMUNITY): Payer: Self-pay | Admitting: Pharmacy Technician

## 2020-11-03 ENCOUNTER — Other Ambulatory Visit: Payer: Self-pay

## 2020-11-03 ENCOUNTER — Emergency Department (HOSPITAL_COMMUNITY): Payer: BC Managed Care – PPO

## 2020-11-03 DIAGNOSIS — M5020 Other cervical disc displacement, unspecified cervical region: Secondary | ICD-10-CM

## 2020-11-03 DIAGNOSIS — M5021 Other cervical disc displacement,  high cervical region: Secondary | ICD-10-CM | POA: Insufficient documentation

## 2020-11-03 DIAGNOSIS — M25511 Pain in right shoulder: Secondary | ICD-10-CM | POA: Insufficient documentation

## 2020-11-03 DIAGNOSIS — M4722 Other spondylosis with radiculopathy, cervical region: Secondary | ICD-10-CM | POA: Diagnosis not present

## 2020-11-03 DIAGNOSIS — M50221 Other cervical disc displacement at C4-C5 level: Secondary | ICD-10-CM | POA: Diagnosis not present

## 2020-11-03 DIAGNOSIS — M5012 Mid-cervical disc disorder, unspecified level: Secondary | ICD-10-CM | POA: Diagnosis not present

## 2020-11-03 DIAGNOSIS — E119 Type 2 diabetes mellitus without complications: Secondary | ICD-10-CM | POA: Insufficient documentation

## 2020-11-03 DIAGNOSIS — S22009A Unspecified fracture of unspecified thoracic vertebra, initial encounter for closed fracture: Secondary | ICD-10-CM | POA: Diagnosis not present

## 2020-11-03 DIAGNOSIS — R0789 Other chest pain: Secondary | ICD-10-CM | POA: Diagnosis not present

## 2020-11-03 DIAGNOSIS — M4802 Spinal stenosis, cervical region: Secondary | ICD-10-CM | POA: Diagnosis not present

## 2020-11-03 DIAGNOSIS — M542 Cervicalgia: Secondary | ICD-10-CM | POA: Diagnosis not present

## 2020-11-03 DIAGNOSIS — M79601 Pain in right arm: Secondary | ICD-10-CM

## 2020-11-03 DIAGNOSIS — R531 Weakness: Secondary | ICD-10-CM | POA: Diagnosis not present

## 2020-11-03 LAB — CBC WITH DIFFERENTIAL/PLATELET
Abs Immature Granulocytes: 0.01 K/uL (ref 0.00–0.07)
Basophils Absolute: 0 K/uL (ref 0.0–0.1)
Basophils Relative: 0 %
Eosinophils Absolute: 0 K/uL (ref 0.0–0.5)
Eosinophils Relative: 0 %
HCT: 44.8 % (ref 39.0–52.0)
Hemoglobin: 14.8 g/dL (ref 13.0–17.0)
Immature Granulocytes: 0 %
Lymphocytes Relative: 28 %
Lymphs Abs: 1.4 K/uL (ref 0.7–4.0)
MCH: 26.8 pg (ref 26.0–34.0)
MCHC: 33 g/dL (ref 30.0–36.0)
MCV: 81 fL (ref 80.0–100.0)
Monocytes Absolute: 0.2 K/uL (ref 0.1–1.0)
Monocytes Relative: 5 %
Neutro Abs: 3.2 K/uL (ref 1.7–7.7)
Neutrophils Relative %: 67 %
Platelets: 300 K/uL (ref 150–400)
RBC: 5.53 MIL/uL (ref 4.22–5.81)
RDW: 12.7 % (ref 11.5–15.5)
WBC: 4.9 K/uL (ref 4.0–10.5)
nRBC: 0 % (ref 0.0–0.2)

## 2020-11-03 LAB — BASIC METABOLIC PANEL
Anion gap: 12 (ref 5–15)
BUN: 11 mg/dL (ref 6–20)
CO2: 23 mmol/L (ref 22–32)
Calcium: 9.8 mg/dL (ref 8.9–10.3)
Chloride: 93 mmol/L — ABNORMAL LOW (ref 98–111)
Creatinine, Ser: 1.03 mg/dL (ref 0.61–1.24)
GFR, Estimated: >60 mL/min (ref >60)
Glucose, Bld: 158 mg/dL — ABNORMAL HIGH (ref 70–99)
Potassium: 3.9 mmol/L (ref 3.5–5.1)
Sodium: 128 mmol/L — ABNORMAL LOW (ref 135–145)

## 2020-11-03 LAB — D-DIMER, QUANTITATIVE: D-Dimer, Quant: <0.27 ug{FEU}/mL (ref 0.00–0.50)

## 2020-11-03 LAB — CBG MONITORING, ED: Glucose-Capillary: 95 mg/dL (ref 70–99)

## 2020-11-03 MED ORDER — ONDANSETRON 4 MG PO TBDP
4.0000 mg | ORAL_TABLET | Freq: Once | ORAL | Status: AC
  Administered 2020-11-03: 4 mg via ORAL
  Filled 2020-11-03: qty 1

## 2020-11-03 MED ORDER — OXYCODONE-ACETAMINOPHEN 5-325 MG PO TABS
2.0000 | ORAL_TABLET | Freq: Once | ORAL | Status: AC
  Administered 2020-11-03: 2 via ORAL
  Filled 2020-11-03: qty 2

## 2020-11-03 MED ORDER — OXYCODONE-ACETAMINOPHEN 5-325 MG PO TABS
1.0000 | ORAL_TABLET | Freq: Once | ORAL | Status: AC
  Administered 2020-11-03: 1 via ORAL
  Filled 2020-11-03: qty 1

## 2020-11-03 MED ORDER — TIZANIDINE HCL 4 MG PO TABS: 4.0000 mg | ORAL_TABLET | Freq: Four times a day (QID) | ORAL | 0 refills | Status: DC | PRN

## 2020-11-03 MED ORDER — LIDOCAINE 5 % EX OINT: 1.0000 "application " | TOPICAL_OINTMENT | CUTANEOUS | 0 refills | Status: AC | PRN

## 2020-11-03 MED ORDER — OXYCODONE-ACETAMINOPHEN 5-325 MG PO TABS: 1.0000 | ORAL_TABLET | Freq: Four times a day (QID) | ORAL | 0 refills | Status: DC | PRN

## 2020-11-03 NOTE — Discharge Instructions (Addendum)
Please read through the included information about additional care such as heating pads, over-the-counter pain medicine.  If you were provided a prescription please use it only as needed and as instructed.  Remember that early mobility and using the affected part of your body is actually better than keeping it immobile.  Follow-up with the doctor listed as recommended or return to the emergency department with new or worsening symptoms that concern you.  You should follow-up with a neurosurgeon as soon as possible when you return home.

## 2020-11-03 NOTE — ED Provider Notes (Signed)
Emergency Department Provider Note   I have reviewed the triage vital signs and the nursing notes.   HISTORY  Chief Complaint Chest Pain and Shoulder Pain   HPI Lucas Reyes is a 45 y.o. male with PMH of DM and DVT presents to the ED for evaluation of severe right shoulder pain.  Symptoms have been progressively worsening over days to weeks.  He reports pain to the back of the right shoulder radiating down into the right arm.  He feels some subjective weakness in the arm.  Denies injury.  No joint redness, swelling, fever.  No pain into the neck but has known degenerative changes in the neck.  He went to [facility] yesterday and has been on steroid along with anti-inflammatory medications.  He was referred to an orthopedist but does not have an appointment as of yet.  He is not having any symptoms in the legs.  No face symptoms.  With uncontrolled pain and weakness he presents for evaluation.  Past Medical History:  Diagnosis Date   Diabetes mellitus without complication (HCC)    DVT (deep venous thrombosis) (HCC) 11/2019    Patient Active Problem List   Diagnosis Date Noted   Atrial septal defect, secundum 03/18/2020   Pressure in head 03/09/2020   White matter abnormality on MRI of brain 03/09/2020   History of DVT in adulthood 03/09/2020   Vision disturbance 03/09/2020    Past Surgical History:  Procedure Laterality Date   NO PAST SURGERIES      Allergies Ivp dye [iodinated diagnostic agents]  No family history on file.  Social History Social History   Tobacco Use   Smoking status: Never   Smokeless tobacco: Never  Substance Use Topics   Alcohol use: Not Currently   Drug use: Not Currently    Review of Systems  Constitutional: No fever/chills Eyes: No visual changes. ENT: No sore throat. Cardiovascular: Denies chest pain. Respiratory: Denies shortness of breath. Gastrointestinal: No abdominal pain.  No nausea, no vomiting.  No diarrhea.  No  constipation. Genitourinary: Negative for dysuria. Musculoskeletal: Negative for back pain. Positive right shoulder/arm pain.  Skin: Negative for rash. Neurological: Negative for headaches. No numbness. Positive right arm weakness.   10-point ROS otherwise negative.  ____________________________________________   PHYSICAL EXAM:  VITAL SIGNS: ED Triage Vitals  Enc Vitals Group     BP 11/03/20 1145 125/85     Pulse Rate 11/03/20 1145 97     Resp 11/03/20 1145 (!) 24     Temp 11/03/20 1145 98.1 F (36.7 C)     Temp Source 11/03/20 2119 Oral     SpO2 11/03/20 1145 100 %   Constitutional: Alert and oriented. Intermittently uncomfortable with RUE movement.  Eyes: Conjunctivae are normal. Head: Atraumatic. Nose: No congestion/rhinnorhea. Mouth/Throat: Mucous membranes are moist.   Neck: No stridor.   Cardiovascular: Normal rate, regular rhythm. Good peripheral circulation. Grossly normal heart sounds.   Respiratory: Normal respiratory effort.  No retractions. Lungs CTAB. Gastrointestinal: Soft and nontender. No distention.  Musculoskeletal: Pain with abduction of the right shoulder. Joint is not warm or erythematous. No midline cervical spine tenderness.  Neurologic:  Normal speech and language. 4+/5 strength in the RUE with grip strength. Normal strength/sensation in the LUE and bilateral LEs. Normal gait.  Skin:  Skin is warm, dry and intact. No rash noted.   ____________________________________________   LABS (all labs ordered are listed, but only abnormal results are displayed)  Labs Reviewed  BASIC METABOLIC PANEL -  Abnormal; Notable for the following components:      Result Value   Sodium 128 (*)    Chloride 93 (*)    Glucose, Bld 158 (*)    All other components within normal limits  CBC WITH DIFFERENTIAL/PLATELET  D-DIMER, QUANTITATIVE  CBG MONITORING, ED   ____________________________________________  EKG   EKG Interpretation  Date/Time:  Tuesday November 03 2020 11:43:00 EDT Ventricular Rate:  98 PR Interval:  154 QRS Duration: 74 QT Interval:  332 QTC Calculation: 423 R Axis:   58 Text Interpretation: Normal sinus rhythm Biatrial enlargement Nonspecific ST and T wave abnormality Abnormal ECG Similar to yesterday's tracing Confirmed by Alona Bene [phone]) on 11/03/2020 8:28:23 PM        ____________________________________________  RADIOLOGY  CT Cervical Spine Wo Contrast  Result Date: 11/03/2020 CLINICAL DATA:  Cervical radiculopathy, no red flags; severe right shoulder and chest pain radiating down arm EXAM: CT CERVICAL SPINE WITHOUT CONTRAST TECHNIQUE: Multidetector CT imaging of the cervical spine was performed without intravenous contrast. Multiplanar CT image reconstructions were also generated. COMPARISON:  None. FINDINGS: Alignment: Preserved. Skull base and vertebrae: Vertebral body heights are maintained. There is no focal sclerotic or destructive osseous lesion. Soft tissues and spinal canal: No prevertebral fluid or swelling. No visible canal hematoma. Disc levels: Mild multilevel degenerative changes are present including disc space narrowing, endplate osteophytes, and uncovertebral hypertrophy. Canal is not well evaluated due to artifact below the C5-C6 level. No high-grade canal narrowing. Mild right foraminal stenosis at C3-C4. Upper chest: Included lung apices are clear. Other: None. IMPRESSION: Mild multilevel degenerative changes. No high-grade canal stenosis identified. Mild right foraminal stenosis at C3-C4. Electronically Signed   By: Guadlupe Spanish M.D.   On: 11/03/2020 14:31    ____________________________________________   PROCEDURES  Procedure(s) performed:   Procedures  None  ____________________________________________   INITIAL IMPRESSION / ASSESSMENT AND PLAN / ED COURSE  Pertinent labs & imaging results that were available during my care of the patient were reviewed by me and considered in my medical  decision making (see chart for details).   Patient presents to the emergency department with pain mainly in the right shoulder.  Some radiation into the chest but this seems very musculoskeletal.  He had work-up during the MSE process including D-dimer which was negative.  Pain is very atypical for ACS.  Exceedingly low suspicion for PE.  Vital signs are within normal limits.  CT imaging of the cervical spine obtained from triage showing multiple levels of degenerative change.  Question some weakness in the right upper extremity.  This may be pain related but given progressively worsening symptoms not responding to what appears to be appropriate treatment I will obtain an MRI of the cervical spine.   CT and shoulder imaging reviewed. MRI pending. Care transferred to Dr. Judd Lien.  ____________________________________________  FINAL CLINICAL IMPRESSION(S) / ED DIAGNOSES  Final diagnoses:  Herniated cervical disc     MEDICATIONS GIVEN DURING THIS VISIT:  Medications  oxyCODONE-acetaminophen (PERCOCET/ROXICET) 5-325 MG per tablet 2 tablet (2 tablets Oral Given 11/03/20 1147)  ondansetron (ZOFRAN-ODT) disintegrating tablet 4 mg (4 mg Oral Given 11/03/20 1147)  oxyCODONE-acetaminophen (PERCOCET/ROXICET) 5-325 MG per tablet 1 tablet (1 tablet Oral Given 11/03/20 2146)    Note:  This document was prepared using Dragon voice recognition software and may include unintentional dictation errors.  Alona Bene, MD, [facility] Emergency Medicine    Jakub Debold, Arlyss Repress, MD 11/06/20 0730

## 2020-11-03 NOTE — ED Provider Notes (Signed)
Emergency Medicine Provider Triage Evaluation Note  Lucas Reyes , a 45 y.o. male  was evaluated in triage.  Pt complains of severe right shoulder and right arm pain.  This has been going on for several months.  He was seen at [facility] ED yesterday where they did an x-ray of his neck.  He has a history of previous DVT.  Patient complains of pain radiating around the right side of his chest.  He is having shortness of breath.  Patient is very tearful.  Requesting an MRI.Marland Kitchen  Review of Systems  Positive: Right arm pain Negative: Numbness  Physical Exam  There were no vitals taken for this visit. Gen:   Awake, no distress   Resp:  Guarded effort, tachypnea MSK:   Moves extremities without difficulty  Other:  No obvious myelopathy or atrophy of the right arm\reproducible pain in the trapezius region  Medical Decision Making  Medically screening exam initiated at 11:40 AM.  Appropriate orders placed.  Bach Rocchi was informed that the remainder of the evaluation will be completed by another provider, this initial triage assessment does not replace that evaluation, and the importance of remaining in the ED until their evaluation is complete.  Patient here complaining of right arm and shoulder pain.  I have ordered a CT of the neck.  He has a history of DVT.  Contrast allergy noted.  I ordered a D-dimer.  Patient given pain medication here in the triage   Arthor Captain, PA-C 11/03/20 1142    Tanda Rockers A, DO 11/03/20 [phone]

## 2020-11-03 NOTE — ED Triage Notes (Signed)
Pt here with reports of having R shoulder pain radiating down his whole arm. Pt now states he is having severe chest pain as well. Pt clutching his chest in triage. Hx DVT.

## 2020-11-04 MED ORDER — PREDNISONE 20 MG PO TABS: ORAL_TABLET | ORAL | 0 refills | Status: AC

## 2020-11-04 MED ORDER — TIZANIDINE HCL 4 MG PO TABS: 4.0000 mg | ORAL_TABLET | Freq: Four times a day (QID) | ORAL | 0 refills | Status: DC | PRN

## 2020-11-04 MED ORDER — TIZANIDINE HCL 4 MG PO TABS: 4.0000 mg | ORAL_TABLET | Freq: Four times a day (QID) | ORAL | 0 refills | Status: AC | PRN

## 2020-11-04 MED ORDER — OXYCODONE-ACETAMINOPHEN 5-325 MG PO TABS: 1.0000 | ORAL_TABLET | ORAL | 0 refills | Status: AC | PRN

## 2020-11-04 MED ORDER — OXYCODONE-ACETAMINOPHEN 5-325 MG PO TABS: 1.0000 | ORAL_TABLET | Freq: Four times a day (QID) | ORAL | 0 refills | Status: DC | PRN

## 2020-11-04 MED ORDER — LIDOCAINE 5 % EX PTCH: 1.0000 | MEDICATED_PATCH | CUTANEOUS | 0 refills | Status: AC

## 2020-11-04 NOTE — ED Provider Notes (Signed)
  Physical Exam  BP 115/73 (BP Location: Right Arm)   Pulse 73   Temp 98.9 F (37.2 C) (Oral)   Resp 18   SpO2 100%   Physical Exam  ED Course/Procedures     Procedures  MDM  Care assumed from Dr. Jacqulyn Bath at shift change.  Patient awaiting results of MRI which was obtained of the cervical spine for radicular pain.  Study has resulted and shows a large right subarticular disc extrusion with inferior migration at C6-C7 with resultant moderate to severe right-sided spinal stenosis and flattening of the right hemi-cord, but no abnormal cord signal.    Patient appears comfortable and arms seems neurologically intact.  Patient to be discharged with steroids, pain meds, and follow up with neurosurgery.  Patient is here visiting from [place] and leaves tomorrow.  I have advised him to follow up with a neurosurgeon promptly when he returns home.       Geoffery Lyons, MD 11/04/20 [phone]

## 2020-11-04 NOTE — ED Notes (Signed)
Patient verbalizes understanding of discharge instructions. Prescriptions reviewed. MRI results printed out and provided to pt. Opportunity for questioning and answers were provided. Armband removed by staff, pt discharged from ED via wheelchair.

## 2020-11-11 DIAGNOSIS — E7800 Pure hypercholesterolemia, unspecified: Secondary | ICD-10-CM | POA: Diagnosis not present

## 2020-11-11 DIAGNOSIS — M542 Cervicalgia: Secondary | ICD-10-CM | POA: Diagnosis not present

## 2020-11-11 DIAGNOSIS — I739 Peripheral vascular disease, unspecified: Secondary | ICD-10-CM | POA: Diagnosis not present

## 2020-11-11 DIAGNOSIS — G4730 Sleep apnea, unspecified: Secondary | ICD-10-CM | POA: Diagnosis not present

## 2020-11-11 DIAGNOSIS — E871 Hypo-osmolality and hyponatremia: Secondary | ICD-10-CM | POA: Diagnosis not present

## 2020-11-13 DIAGNOSIS — G4730 Sleep apnea, unspecified: Secondary | ICD-10-CM | POA: Diagnosis not present

## 2020-11-13 DIAGNOSIS — E7800 Pure hypercholesterolemia, unspecified: Secondary | ICD-10-CM | POA: Diagnosis not present

## 2020-11-13 DIAGNOSIS — E871 Hypo-osmolality and hyponatremia: Secondary | ICD-10-CM | POA: Diagnosis not present

## 2020-11-13 DIAGNOSIS — J309 Allergic rhinitis, unspecified: Secondary | ICD-10-CM | POA: Diagnosis not present

## 2022-05-08 IMAGING — CT CT HEAD W/O CM
4 series · 17 of 47 positions shown, 19 images · non-contrast
Comparison: None.

CLINICAL DATA: Headache

EXAM:
CT HEAD WITHOUT CONTRAST
TECHNIQUE: Contiguous axial images were obtained from the base of the skull
through the vertex without intravenous contrast.

[Series 3: head without · axial · non-contrast · 0.45mm/px · z∈[+855,+980]mm · 7 of 35 slices shown, 9 images]
[im 5/35  brain]
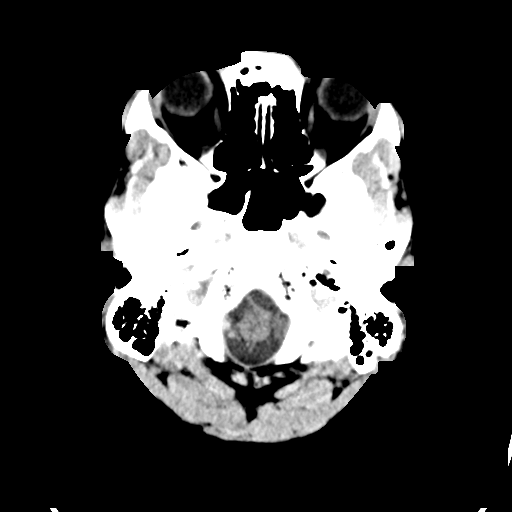
[im 5/35  bone]
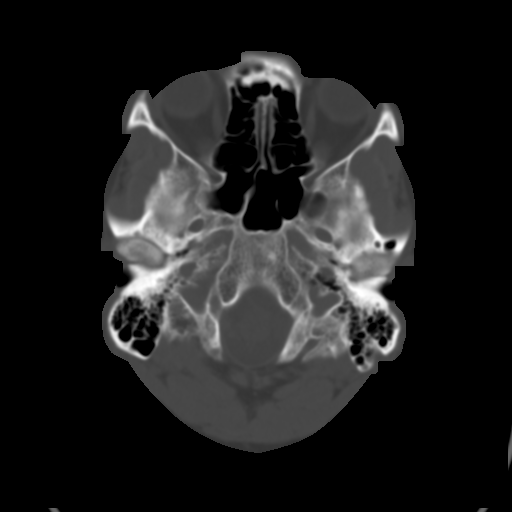
[im 9/35  brain]
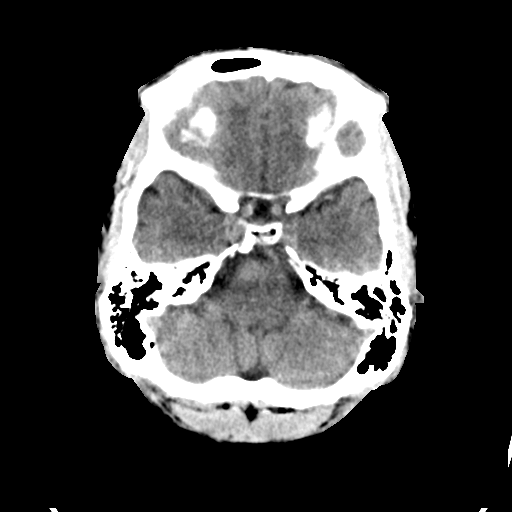
[im 13/35  brain]
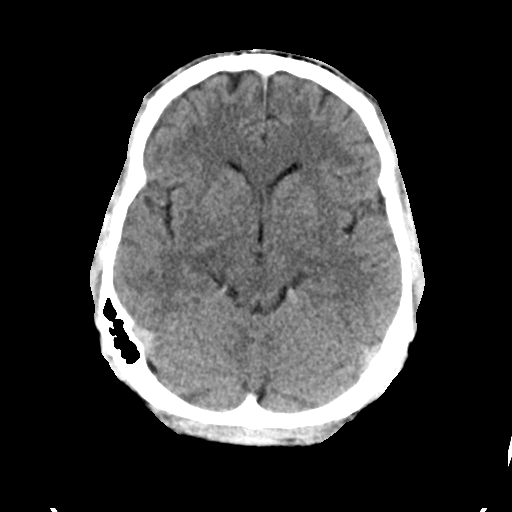
[im 18/35  brain]
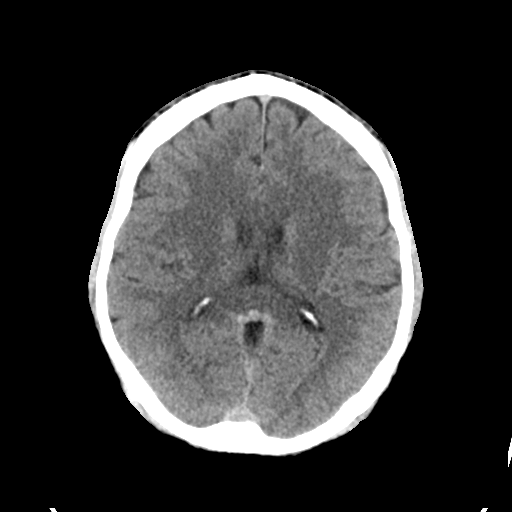
[im 22/35  brain]
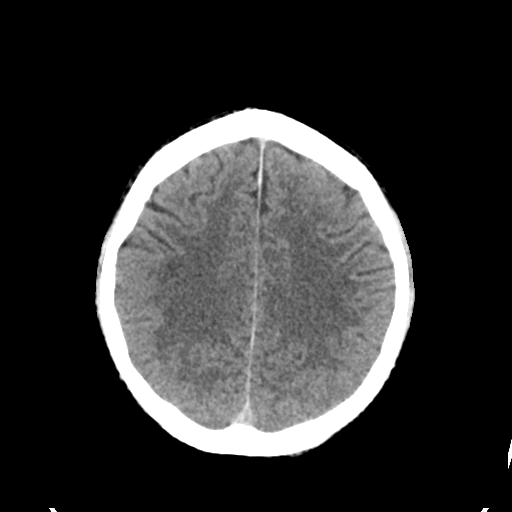
[im 22/35  bone]
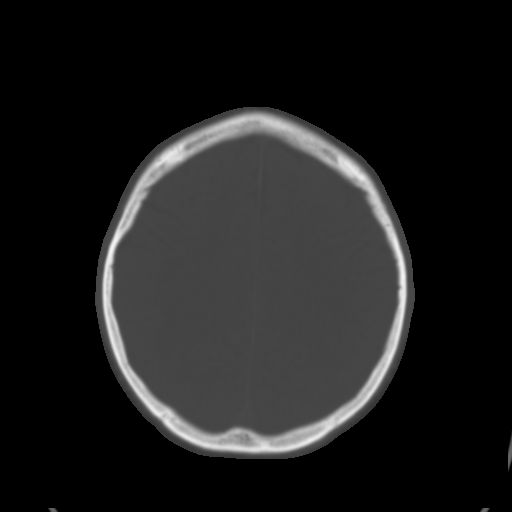
[im 26/35  brain]
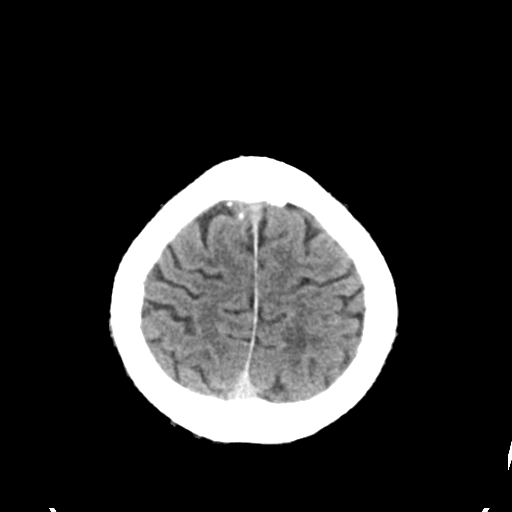
[im 30/35  brain]
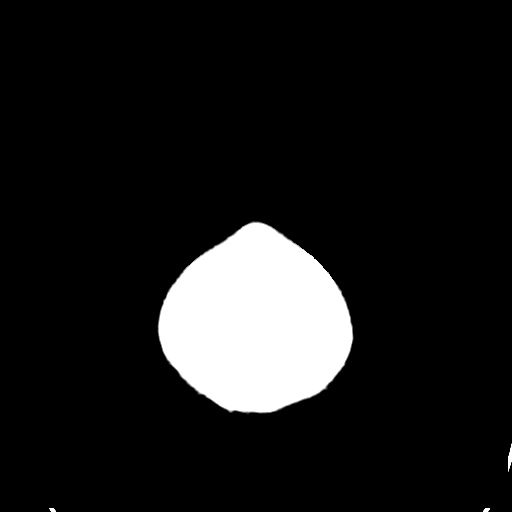

[Series 4: head bone · axial · 0.45mm/px · z∈[+851,+911]mm · 4 of 87 slices shown]
[im 9/87  bone]
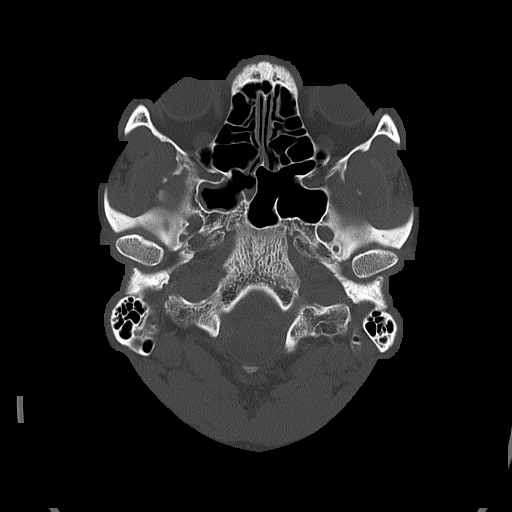
[im 18/87  bone]
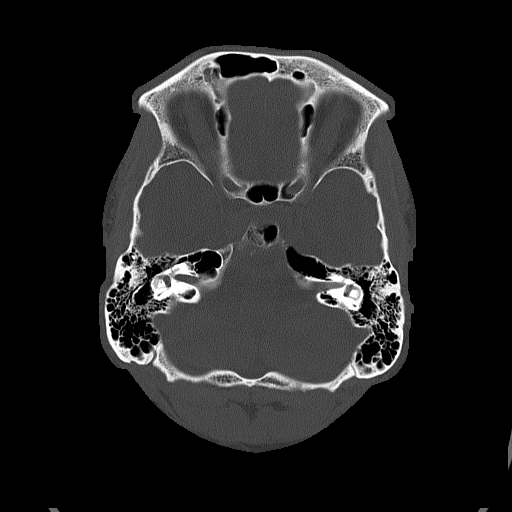
[im 26/87  bone]
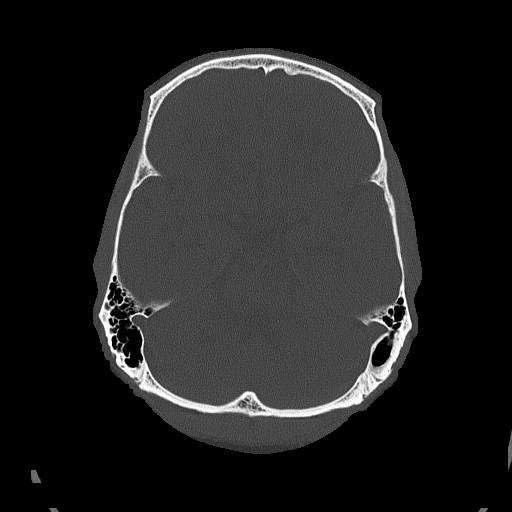
[im 39/87  bone]
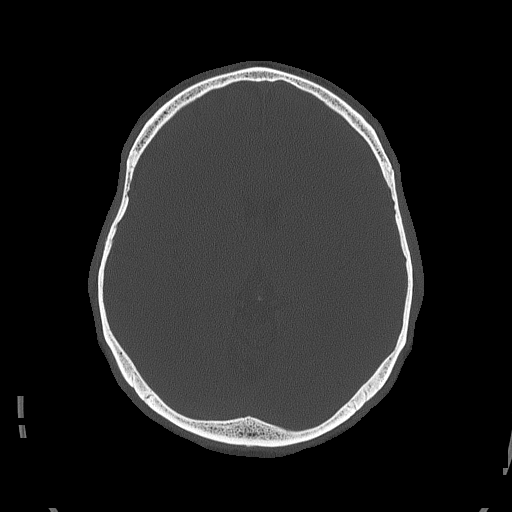

[Series 5: head without cor · coronal · non-contrast · 0.34mm/px · 3 of 72 slices shown]
[im 24/72  brain]
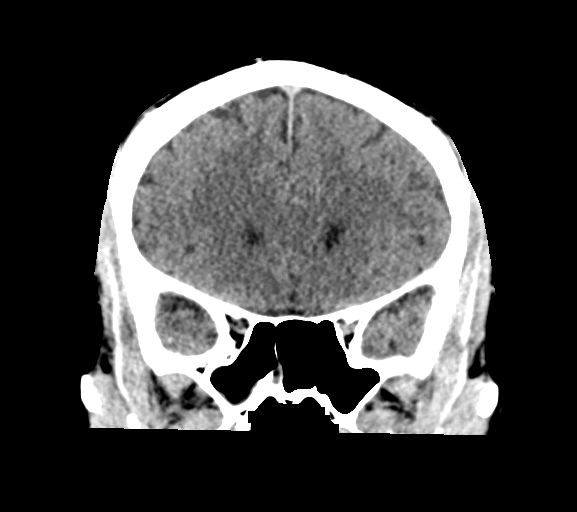
[im 32/72  brain]
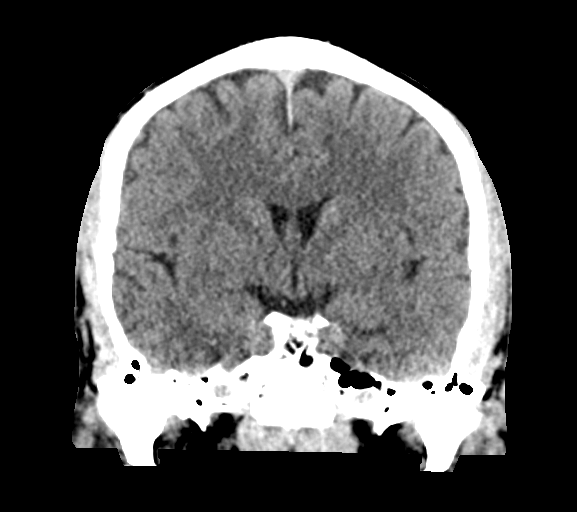
[im 40/72  brain]
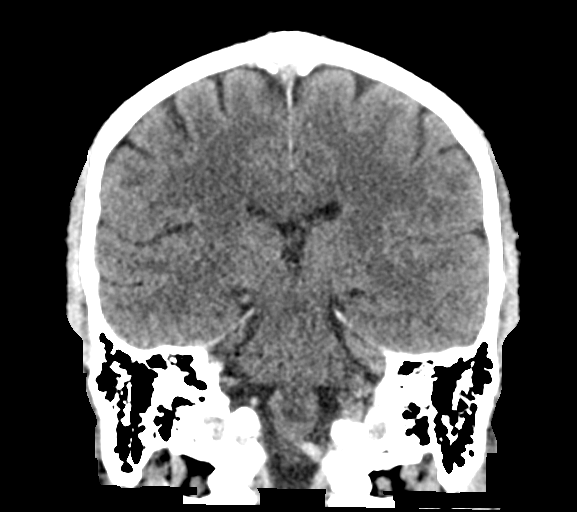

[Series 6: head without sag · sagittal · non-contrast · 0.36mm/px · 3 of 67 slices shown]
[im 23/67  brain]
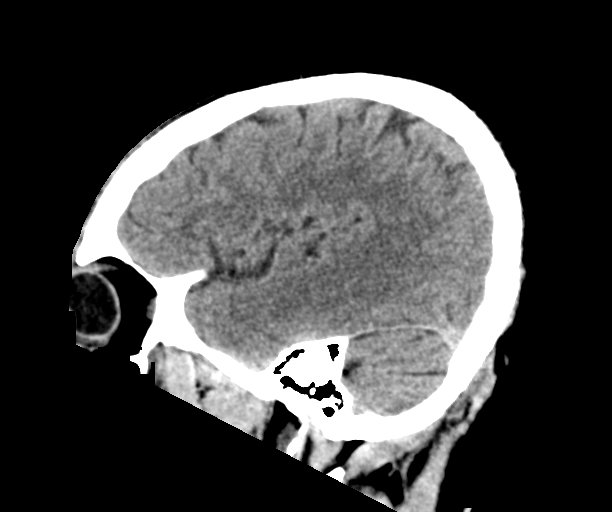
[im 34/67  brain]
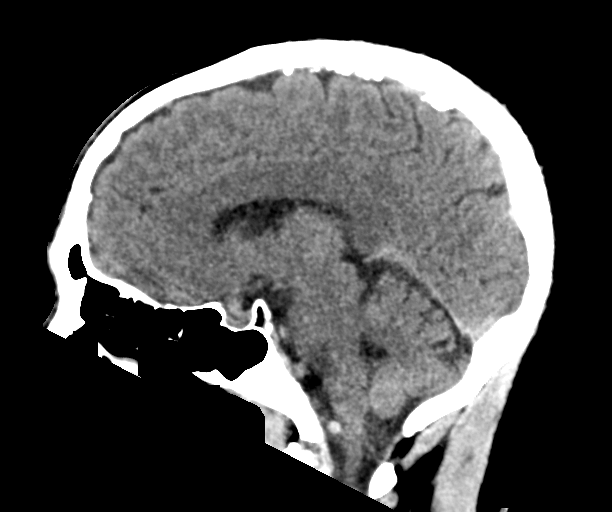
[im 45/67  brain]
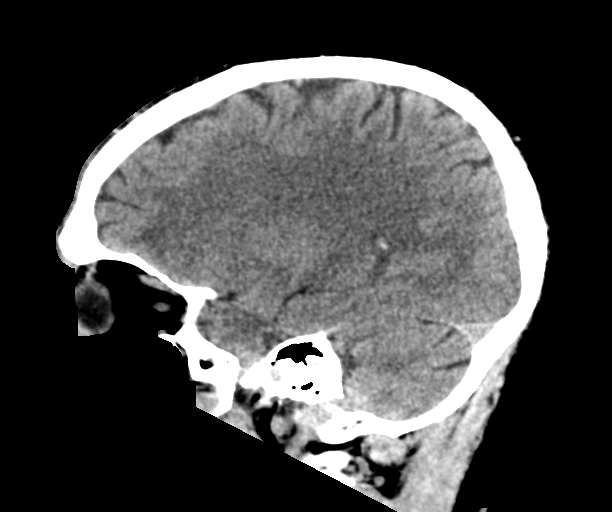

[17 of 47 positions shown; findings below may reference images not displayed]

FINDINGS: Brain: No evidence of acute territorial infarction, hemorrhage,
hydrocephalus,extra-axial collection or mass lesion/mass effect.
Normal gray-white differentiation. Ventricles are normal in size and
contour.

Vascular: No hyperdense vessel or unexpected calcification.

Skull: The skull is intact. No fracture or focal lesion identified.

Sinuses/Orbits: Right sphenoid sinus mucosal thickening is seen.
The orbits and globes intact.

Other: None
IMPRESSION: No acute intracranial abnormality.
